# Patient Record
Sex: Female | Born: 1975 | Race: White | Hispanic: No | Marital: Married | Smoking: Never smoker
Health system: Southern US, Community
[De-identification: ages and names within clinical notes are randomized; demographics above are authoritative.]

## PROBLEM LIST (undated history)

## (undated) DIAGNOSIS — N800 Endometriosis of uterus: Secondary | ICD-10-CM

## (undated) DIAGNOSIS — F988 Other specified behavioral and emotional disorders with onset usually occurring in childhood and adolescence: Secondary | ICD-10-CM

## (undated) DIAGNOSIS — F32A Depression, unspecified: Secondary | ICD-10-CM

## (undated) DIAGNOSIS — R7611 Nonspecific reaction to tuberculin skin test without active tuberculosis: Secondary | ICD-10-CM

## (undated) DIAGNOSIS — I7300 Raynaud's syndrome without gangrene: Secondary | ICD-10-CM

## (undated) DIAGNOSIS — F329 Major depressive disorder, single episode, unspecified: Secondary | ICD-10-CM

## (undated) DIAGNOSIS — E039 Hypothyroidism, unspecified: Secondary | ICD-10-CM

## (undated) HISTORY — PX: COLONOSCOPY: SHX174

## (undated) HISTORY — PX: DIAGNOSTIC LAPAROSCOPY: SUR761

## (undated) HISTORY — PX: CERVICAL CONE BIOPSY: SUR198

## (undated) HISTORY — PX: ESOPHAGOGASTRODUODENOSCOPY: SHX1529

---

## 2002-11-28 ENCOUNTER — Other Ambulatory Visit: Admission: RE | Admit: 2002-11-28 | Discharge: 2002-11-28 | Payer: Self-pay | Admitting: Family Medicine

## 2002-12-04 ENCOUNTER — Encounter: Admission: RE | Admit: 2002-12-04 | Discharge: 2002-12-26 | Payer: Self-pay | Admitting: Family Medicine

## 2003-12-18 ENCOUNTER — Other Ambulatory Visit: Admission: RE | Admit: 2003-12-18 | Discharge: 2003-12-18 | Payer: Self-pay | Admitting: Family Medicine

## 2008-05-30 ENCOUNTER — Emergency Department (HOSPITAL_COMMUNITY): Admission: EM | Admit: 2008-05-30 | Discharge: 2008-05-30 | Payer: Self-pay | Admitting: *Deleted

## 2008-09-14 ENCOUNTER — Ambulatory Visit: Payer: Self-pay

## 2008-11-19 ENCOUNTER — Other Ambulatory Visit: Payer: Self-pay | Admitting: Physician Assistant

## 2008-11-24 ENCOUNTER — Other Ambulatory Visit: Payer: Self-pay | Admitting: Internal Medicine

## 2009-01-08 ENCOUNTER — Ambulatory Visit: Payer: Self-pay | Admitting: Gastroenterology

## 2009-01-12 ENCOUNTER — Other Ambulatory Visit: Payer: Self-pay | Admitting: Rheumatology

## 2009-01-22 ENCOUNTER — Telehealth: Payer: Self-pay | Admitting: Gastroenterology

## 2009-05-10 ENCOUNTER — Other Ambulatory Visit: Payer: Self-pay | Admitting: Obstetrics and Gynecology

## 2009-05-31 ENCOUNTER — Ambulatory Visit: Payer: Self-pay | Admitting: Obstetrics and Gynecology

## 2009-06-04 ENCOUNTER — Ambulatory Visit: Payer: Self-pay | Admitting: Obstetrics and Gynecology

## 2009-07-05 ENCOUNTER — Other Ambulatory Visit: Payer: Self-pay | Admitting: Internal Medicine

## 2009-07-09 ENCOUNTER — Other Ambulatory Visit: Payer: Self-pay | Admitting: Internal Medicine

## 2009-07-15 ENCOUNTER — Other Ambulatory Visit: Payer: Self-pay | Admitting: Internal Medicine

## 2009-11-19 ENCOUNTER — Other Ambulatory Visit: Payer: Self-pay | Admitting: Internal Medicine

## 2010-06-16 ENCOUNTER — Encounter: Payer: Self-pay | Admitting: Obstetrics and Gynecology

## 2010-12-06 ENCOUNTER — Observation Stay: Payer: Self-pay | Admitting: Obstetrics and Gynecology

## 2010-12-12 ENCOUNTER — Observation Stay: Payer: Self-pay | Admitting: Obstetrics and Gynecology

## 2010-12-19 ENCOUNTER — Inpatient Hospital Stay: Payer: Self-pay | Admitting: Obstetrics and Gynecology

## 2012-07-10 ENCOUNTER — Ambulatory Visit: Payer: Self-pay

## 2013-04-04 ENCOUNTER — Other Ambulatory Visit: Payer: Self-pay | Admitting: Neurology

## 2013-04-04 DIAGNOSIS — R413 Other amnesia: Secondary | ICD-10-CM

## 2013-04-04 DIAGNOSIS — R410 Disorientation, unspecified: Secondary | ICD-10-CM

## 2013-04-19 ENCOUNTER — Ambulatory Visit
Admission: RE | Admit: 2013-04-19 | Discharge: 2013-04-19 | Disposition: A | Discharge status: Home / Self Care | Payer: BC Managed Care – PPO | Source: Ambulatory Visit | Attending: Neurology | Admitting: Neurology

## 2013-04-19 DIAGNOSIS — R410 Disorientation, unspecified: Secondary | ICD-10-CM

## 2013-04-19 DIAGNOSIS — R413 Other amnesia: Secondary | ICD-10-CM

## 2013-04-19 MED ORDER — GADOBENATE DIMEGLUMINE 529 MG/ML IV SOLN
11.0000 mL | Freq: Once | INTRAVENOUS | Status: AC | PRN
  Administered 2013-04-19: 11 mL via INTRAVENOUS

## 2014-06-02 ENCOUNTER — Ambulatory Visit: Payer: Self-pay

## 2014-06-19 ENCOUNTER — Ambulatory Visit: Payer: Self-pay

## 2015-03-24 ENCOUNTER — Other Ambulatory Visit: Payer: Self-pay | Admitting: Obstetrics and Gynecology

## 2015-03-24 DIAGNOSIS — N63 Unspecified lump in unspecified breast: Secondary | ICD-10-CM

## 2015-06-04 ENCOUNTER — Other Ambulatory Visit: Payer: Self-pay

## 2015-06-04 ENCOUNTER — Ambulatory Visit: Payer: Self-pay

## 2015-06-18 ENCOUNTER — Other Ambulatory Visit: Payer: Self-pay

## 2015-06-18 ENCOUNTER — Ambulatory Visit: Payer: Self-pay

## 2015-06-25 ENCOUNTER — Ambulatory Visit
Admission: RE | Admit: 2015-06-25 | Discharge: 2015-06-25 | Disposition: A | Discharge status: Home / Self Care | Payer: Managed Care, Other (non HMO) | Source: Ambulatory Visit | Attending: Obstetrics and Gynecology | Admitting: Obstetrics and Gynecology

## 2015-06-25 ENCOUNTER — Other Ambulatory Visit: Payer: Self-pay | Admitting: Obstetrics and Gynecology

## 2015-06-25 DIAGNOSIS — N63 Unspecified lump in unspecified breast: Secondary | ICD-10-CM

## 2015-06-25 DIAGNOSIS — N6489 Other specified disorders of breast: Secondary | ICD-10-CM | POA: Insufficient documentation

## 2015-07-02 ENCOUNTER — Ambulatory Visit: Payer: Managed Care, Other (non HMO)

## 2016-07-06 ENCOUNTER — Encounter: Payer: Self-pay | Admitting: *Deleted

## 2016-07-07 ENCOUNTER — Ambulatory Visit
Admission: RE | Admit: 2016-07-07 | Discharge: 2016-07-07 | Disposition: A | Discharge status: Home / Self Care | Payer: Managed Care, Other (non HMO) | Source: Ambulatory Visit | Attending: Gastroenterology | Admitting: Gastroenterology

## 2016-07-07 ENCOUNTER — Encounter
Admission: RE | Disposition: A | Discharge status: Home / Self Care | Payer: Self-pay | Source: Ambulatory Visit | Attending: Gastroenterology

## 2016-07-07 ENCOUNTER — Ambulatory Visit: Payer: Managed Care, Other (non HMO) | Admitting: Anesthesiology

## 2016-07-07 ENCOUNTER — Encounter: Payer: Self-pay | Admitting: *Deleted

## 2016-07-07 DIAGNOSIS — F909 Attention-deficit hyperactivity disorder, unspecified type: Secondary | ICD-10-CM | POA: Diagnosis not present

## 2016-07-07 DIAGNOSIS — Z800 Family history of malignant neoplasm of digestive organs: Secondary | ICD-10-CM | POA: Insufficient documentation

## 2016-07-07 DIAGNOSIS — Z1211 Encounter for screening for malignant neoplasm of colon: Secondary | ICD-10-CM | POA: Diagnosis not present

## 2016-07-07 DIAGNOSIS — I739 Peripheral vascular disease, unspecified: Secondary | ICD-10-CM | POA: Insufficient documentation

## 2016-07-07 DIAGNOSIS — I7300 Raynaud's syndrome without gangrene: Secondary | ICD-10-CM | POA: Insufficient documentation

## 2016-07-07 DIAGNOSIS — F329 Major depressive disorder, single episode, unspecified: Secondary | ICD-10-CM | POA: Diagnosis not present

## 2016-07-07 DIAGNOSIS — Z79899 Other long term (current) drug therapy: Secondary | ICD-10-CM | POA: Insufficient documentation

## 2016-07-07 DIAGNOSIS — E039 Hypothyroidism, unspecified: Secondary | ICD-10-CM | POA: Insufficient documentation

## 2016-07-07 DIAGNOSIS — K589 Irritable bowel syndrome without diarrhea: Secondary | ICD-10-CM | POA: Insufficient documentation

## 2016-07-07 HISTORY — DX: Other specified behavioral and emotional disorders with onset usually occurring in childhood and adolescence: F98.8

## 2016-07-07 HISTORY — DX: Major depressive disorder, single episode, unspecified: F32.9

## 2016-07-07 HISTORY — DX: Depression, unspecified: F32.A

## 2016-07-07 HISTORY — DX: Endometriosis of uterus: N80.0

## 2016-07-07 HISTORY — DX: Hypothyroidism, unspecified: E03.9

## 2016-07-07 HISTORY — PX: COLONOSCOPY WITH PROPOFOL: SHX5780

## 2016-07-07 HISTORY — DX: Raynaud's syndrome without gangrene: I73.00

## 2016-07-07 HISTORY — DX: Nonspecific reaction to tuberculin skin test without active tuberculosis: R76.11

## 2016-07-07 LAB — MAGNESIUM: MAGNESIUM: 2.1 mg/dL (ref 1.7–2.4)

## 2016-07-07 SURGERY — COLONOSCOPY WITH PROPOFOL
Anesthesia: General

## 2016-07-07 MED ORDER — SODIUM CHLORIDE 0.9 % IV SOLN: INTRAVENOUS | Status: DC

## 2016-07-07 MED ORDER — PROPOFOL 500 MG/50ML IV EMUL
INTRAVENOUS | Status: AC
  Filled 2016-07-07: qty 50

## 2016-07-07 MED ORDER — SODIUM CHLORIDE 0.9 % IV SOLN
INTRAVENOUS | Status: DC
  Administered 2016-07-07: 09:00:00 via INTRAVENOUS

## 2016-07-07 MED ORDER — PROPOFOL 500 MG/50ML IV EMUL
INTRAVENOUS | Status: DC | PRN
  Administered 2016-07-07: 100 ug/kg/min via INTRAVENOUS

## 2016-07-07 MED ORDER — KETOROLAC TROMETHAMINE 30 MG/ML IJ SOLN
INTRAMUSCULAR | Status: DC | PRN
  Administered 2016-07-07: 600 mg via INTRAVENOUS

## 2016-07-07 NOTE — H&P (Signed)
Outpatient short stay form Pre-procedure 07/07/2016 9:05 AM Christena DeemMartin U Chris Narasimhan MD  Primary Physician: Dr. Daniel NonesBert Klein  Reason for visit:  Colonoscopy  History of present illness:  Patient is a 41 year old female presenting today as above. She has a personal history of IBS c and has [facility]. Depacon review was relatively normal. Her last colonoscopy was about 7 years or so ago this indicating a mildly dilated colon. He is on high-dose MiraLAX. Have a history of Raynaud's syndrome. She tolerated her prep well. She takes no aspirin or blood thinning agents. She has a recent historical finding of her sister having colon cancer requiring surgery about 6 months ago.    Current Facility-Administered Medications:  .  0.9 %  sodium chloride infusion, , Intravenous, Continuous, Christena DeemMartin U Jasdeep Dejarnett, MD .  0.9 %  sodium chloride infusion, , Intravenous, Continuous, Christena DeemMartin U Iliany Losier, MD  Prescriptions Prior to Admission  Medication Sig Dispense Refill Last Dose  . amLODipine (NORVASC) 2.5 MG tablet Take 5 mg by mouth daily.   07/06/2016 at Unknown time  . Ascorbic Acid (VITAMIN C) 1000 MG tablet Take 1,000 mg by mouth 2 (two) times daily.   07/06/2016 at Unknown time  . levothyroxine (SYNTHROID, LEVOTHROID) 50 MCG tablet Take 50 mcg by mouth daily before breakfast.   07/07/2016 at Unknown time  . linaclotide (LINZESS) 290 MCG CAPS capsule Take 290 mcg by mouth daily before breakfast.   07/06/2016 at Unknown time  . Multiple Vitamin (MULTIVITAMIN) tablet Take 1 tablet by mouth daily.   07/06/2016 at Unknown time  . Probiotic Product (PROBIOTIC COLON SUPPORT PO) Take 240 mg by mouth daily.     . simethicone (MYLICON) 125 MG chewable tablet Chew 125 mg by mouth 3 (three) times daily with meals.   07/06/2016 at Unknown time     No Known Allergies   Past Medical History:  Diagnosis Date  . ADD (attention deficit disorder) without hyperactivity   . Depression   . Endometriosis of uterus   .  Hypothyroidism   . Positive TB test   . Raynaud's disease     Review of systems:      Physical Exam    Heart and lungs: Regular rate and rhythm without rub or gallop, lungs are bilaterally clear.    HEENT: Normocephalic atraumatic eyes are anicteric    Other:     Pertinant exam for procedure: Soft nontender nondistended bowel sounds positive normoactive.    Planned proceedures: Colonoscopy and indicated procedures. I have discussed the risks benefits and complications of procedures to include not limited to bleeding, infection, perforation and the risk of sedation and the patient wishes to proceed.    Christena DeemMartin U Mearle Drew, MD Gastroenterology 07/07/2016  9:05 AM

## 2016-07-07 NOTE — Transfer of Care (Signed)
Immediate Anesthesia Transfer of Care Note  Patient: Tina Fowler  Procedure(s) Performed: Procedure(s): COLONOSCOPY WITH PROPOFOL (N/A)  Patient Location: PACU  Anesthesia Type:General  Level of Consciousness: awake, alert  and oriented  Airway & Oxygen Therapy: Patient Spontanous Breathing and Patient connected to nasal cannula oxygen  Post-op Assessment: Report given to RN and Post -op Vital signs reviewed and stable  Post vital signs: Reviewed and stable  Last Vitals:  Vitals:   07/07/16 0839  BP: 125/85  Pulse: 65  Resp: 20  Temp: 36.1 C    Last Pain:  Vitals:   07/07/16 0839  TempSrc: Tympanic         Complications: No apparent anesthesia complications

## 2016-07-07 NOTE — Anesthesia Post-op Follow-up Note (Cosign Needed)
Anesthesia QCDR form completed.        

## 2016-07-07 NOTE — Op Note (Addendum)
[facility] Gastroenterology Patient Name: Tina Fowler Procedure Date: 07/07/2016 8:45 AM MRN: [id number] Account #: [id number] Date of Birth: [birth year] Admit Type: Outpatient Age: 41 Room: [facility] ENDO ROOM 3 Gender: Female Note Status: Finalized Procedure:            Colonoscopy Indications:          Family history of colon cancer in a first-degree                        relative Providers:            Christena Deem, MD Referring MD:         Daniel Nones, MD (Referring MD) Medicines:            Monitored Anesthesia Care Complications:        No immediate complications. Procedure:            Pre-Anesthesia Assessment:                       - ASA Grade Assessment: II - A patient with mild                        systemic disease.                       After obtaining informed consent, the colonoscope was                        passed under direct vision. Throughout the procedure,                        the patient's blood pressure, pulse, and oxygen                        saturations were monitored continuously. The                        Colonoscope was introduced through the anus and                        advanced to the the cecum, identified by appendiceal                        orifice and ileocecal valve. The colonoscopy was                        performed without difficulty. The patient tolerated the                        procedure well. The quality of the bowel preparation                        was good. Findings:      The sigmoid colon, descending colon and transverse colon were moderately       tortuous.      A diffuse area of minimal melanosis was found in the entire colon.      The exam was otherwise without abnormality.      The digital rectal exam findings include finding of a high resting tone       at the anal orifice without evidence of actual stricture.  The retroflexed view of the distal rectum and anal verge was normal and        showed no anal or rectal abnormalities. Impression:           - Tortuous colon.                       - Melanosis in the colon.                       - The examination was otherwise normal.                       - Finding of a high resting tone at the anal orifice                        found on digital rectal exam.                       - The distal rectum and anal verge are normal on                        retroflexion view.                       - No specimens collected. Recommendation:       - Discharge patient to home.                       - Perform anal manometry at appointment to be scheduled.                       - Check anticentromere antibody and SCL 70, Magnesium                        and calcium levels. today.                       - Perform colonic transit study at appointment to be                        scheduled. Procedure Code(s):    --- Professional ---                       [phone], Colonoscopy, flexible; diagnostic, including                        collection of specimen(s) by brushing or washing, when                        performed (separate procedure) Diagnosis Code(s):    --- Professional ---                       X91.47, Other specified diseases of intestine                       Z80.0, Family history of malignant neoplasm of                        digestive organs                       Q43.8, Other  specified congenital malformations of                        intestine CPT copyright 2016 American Medical Association. All rights reserved. The codes documented in this report are preliminary and upon coder review may  be revised to meet current compliance requirements. Christena Deem, MD 07/07/2016 9:43:20 AM This report has been signed electronically. Number of Addenda: 0 Note Initiated On: 07/07/2016 8:45 AM Scope Withdrawal Time: 0 hours 9 minutes 52 seconds  Total Procedure Duration: 0 hours 21 minutes 54 seconds       [facility]

## 2016-07-07 NOTE — Anesthesia Preprocedure Evaluation (Signed)
Anesthesia Evaluation  Patient identified by MRN, date of birth, ID band Patient awake    Reviewed: Allergy & Precautions, H&P , NPO status , Patient's Chart, lab work & pertinent test results, reviewed documented beta blocker date and time   Airway Mallampati: II   Neck ROM: full    Dental  (+) Teeth Intact   Pulmonary neg pulmonary ROS,    Pulmonary exam normal        Cardiovascular + Peripheral Vascular Disease  negative cardio ROS Normal cardiovascular exam Rhythm:regular Rate:Normal     Neuro/Psych PSYCHIATRIC DISORDERS negative neurological ROS  negative psych ROS   GI/Hepatic negative GI ROS, Neg liver ROS,   Endo/Other  negative endocrine ROSHypothyroidism   Renal/GU negative Renal ROS  negative genitourinary   Musculoskeletal   Abdominal   Peds  Hematology negative hematology ROS (+)   Anesthesia Other Findings Past Medical History: No date: ADD (attention deficit disorder) without hyper* No date: Depression No date: Endometriosis of uterus No date: Hypothyroidism No date: Positive TB test No date: Raynaud's disease Past Surgical History: No date: CERVICAL CONE BIOPSY No date: COLONOSCOPY No date: DIAGNOSTIC LAPAROSCOPY     Comment: Pelvic (endometriosis) No date: ESOPHAGOGASTRODUODENOSCOPY BMI    Body Mass Index:  21.30 kg/m     Reproductive/Obstetrics negative OB ROS                             Anesthesia Physical Anesthesia Plan  ASA: II  Anesthesia Plan: General   Post-op Pain Management:    Induction:   Airway Management Planned:   Additional Equipment:   Intra-op Plan:   Post-operative Plan:   Informed Consent: I have reviewed the patients History and Physical, chart, labs and discussed the procedure including the risks, benefits and alternatives for the proposed anesthesia with the patient or authorized representative who has indicated his/her  understanding and acceptance.   Dental Advisory Given  Plan Discussed with: CRNA  Anesthesia Plan Comments:         Anesthesia Quick Evaluation

## 2016-07-08 LAB — MISC LABCORP TEST (SEND OUT): LABCORP TEST CODE: 164814

## 2016-07-08 LAB — CALCIUM, IONIZED: CALCIUM, IONIZED, SERUM: 4.9 mg/dL (ref 4.5–5.6)

## 2016-07-08 LAB — ANTI-SCLERODERMA ANTIBODY

## 2016-07-10 ENCOUNTER — Encounter: Payer: Self-pay | Admitting: Gastroenterology

## 2016-07-11 NOTE — Anesthesia Postprocedure Evaluation (Signed)
Anesthesia Post Note  Patient: Tina Fowler [name] Procedure(s) Performed: Procedure(s) (LRB): COLONOSCOPY WITH PROPOFOL (N/A)  Patient location during evaluation: PACU Anesthesia Type: General Level of consciousness: awake and alert Pain management: pain level not controlled Vital Signs Assessment: post-procedure vital signs reviewed and stable Respiratory status: spontaneous breathing, nonlabored ventilation, respiratory function stable and patient connected to nasal cannula oxygen Cardiovascular status: blood pressure returned to baseline and stable Postop Assessment: no signs of nausea or vomiting Anesthetic complications: no     Last Vitals:  Vitals:   07/07/16 0959 07/07/16 1009  BP: 109/80 118/85  Pulse: 65 61  Resp: 18 11  Temp:      Last Pain:  Vitals:   07/07/16 0939  TempSrc: Tympanic                 Yevette EdwardsJames G Adams

## 2019-05-27 ENCOUNTER — Ambulatory Visit: Payer: Managed Care, Other (non HMO)

## 2019-06-06 ENCOUNTER — Ambulatory Visit: Payer: Managed Care, Other (non HMO)

## 2019-06-10 ENCOUNTER — Ambulatory Visit: Payer: Managed Care, Other (non HMO)

## 2019-06-20 ENCOUNTER — Ambulatory Visit: Payer: Managed Care, Other (non HMO) | Attending: Obstetrics & Gynecology

## 2019-06-20 ENCOUNTER — Other Ambulatory Visit: Payer: Self-pay

## 2019-06-20 DIAGNOSIS — M533 Sacrococcygeal disorders, not elsewhere classified: Secondary | ICD-10-CM | POA: Diagnosis present

## 2019-06-20 DIAGNOSIS — M4125 Other idiopathic scoliosis, thoracolumbar region: Secondary | ICD-10-CM | POA: Diagnosis not present

## 2019-06-20 DIAGNOSIS — M62838 Other muscle spasm: Secondary | ICD-10-CM | POA: Diagnosis present

## 2019-06-20 NOTE — Therapy (Signed)
[facility] MAIN [facility] SERVICES [address] [place], [place], [address] Phone: [phone]   Fax:  [phone]  Physical Therapy Treatment  The patient has been informed of current processes in place at Outpatient Rehab to protect patients from Covid-19 exposure including social distancing, schedule modifications, and new cleaning procedures. After discussing their particular risk with a therapist based on the patient's personal risk factors, the patient has decided to proceed with in-person therapy.   Patient Details  Name: Tina Fowler MRN: [id number] Date of Birth: July 10, 1975 No data recorded  Encounter Date: 06/20/2019  PT End of Session - 06/20/19 1342    Visit Number  1    Number of Visits  10    Date for PT Re-Evaluation  08/29/19    Authorization Type  [employer]    Authorization - Visit Number  1    Authorization - Number of Visits  10    Progress Note Due on Visit  10    PT Start Time  0805    PT Stop Time  0905    PT Time Calculation (min)  60 min    Activity Tolerance  Patient tolerated treatment well    Behavior During Therapy  [facility] for tasks assessed/performed       Past Medical History:  Diagnosis Date  . ADD (attention deficit disorder) without hyperactivity   . Depression   . Endometriosis of uterus   . Hypothyroidism   . Positive TB test   . Raynaud's disease     Past Surgical History:  Procedure Laterality Date  . CERVICAL CONE BIOPSY    . COLONOSCOPY    . COLONOSCOPY WITH PROPOFOL N/A 07/07/2016   Procedure: COLONOSCOPY WITH PROPOFOL;  Surgeon: Christena Deem, MD;  Location: [facility] ENDOSCOPY;  Service: Endoscopy;  Laterality: N/A;  . DIAGNOSTIC LAPAROSCOPY     Pelvic (endometriosis)  . ESOPHAGOGASTRODUODENOSCOPY      There were no vitals filed for this visit.   Pelvic Floor Physical Therapy Evaluation and Assessment  SCREENING  Falls in last 6 mo: no  Red Flags: Have you had any night sweats?  Occasionally for ~ 1 year Unexplained weight loss? none Saddle anesthesia? none Unexplained changes in bowel or bladder habits? none  SUBJECTIVE  Patient reports: Went through something (suspected lupis) where she lost a lot of hair and her hormone levels went crazy, she is doing better other than occasionally "her back is on fire". "If I feel a little bit of stress it affects it".  Has had constipation for years (10+), had balloon test done and was unable to expel balloon.  Taking Melatonin. Chicken bone-broth. Helping with pain Pain/bloating tends to be worse on the L side.  Was doing acupuncture and cupping 1x/month which helped with the BM's which shut down due to covid.  Works out at [employer], tries to strengthen her back "all the time" does ~ 10 min. Of cardio, then weight-lifting 4 days of weight-lifting at medium weight.   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 Saturates a period pad with running, wearing a panty-liner throughout the day.  Drinks only water throughout the day ~ 1/2 gallon.   Gastrointestinal History: Has  had constipation even since she way in high-school and has always had a hard time having a BM. "It is either runny or I don't go"  Eating granola bar or oatmeal for breakfast, almonds for sack, protein shake and a piece of fruit for lunch and salmon and vegetables for dinner.  Sexual activity/pain: Sometimes is uncomfortable because of pressure/bloating/bladder full feeling.   Location of pain: upper to low back worsens during menstrual cycle Current pain:  0/10  (tight)  Max pain:  8/10 Least pain:  0/10 Nature of pain: Sharp and dull  Patient Goals: Having her bowel's work, having a BM daily.   OBJECTIVE  Posture/Observations:  Sitting:  Standing: R ASIS high, slight hyperkyphosis/lordis, R PSIS high Supine: L ASIS high, LLE long Prone: R PSIS high  Palpation/Segmental Motion/Joint Play:  Special tests:   Scoliosis: R lumbar/L thoracic scoliosis Supine-to-long-sit: LLE long in both but less-short in sitting  Range of Motion/Flexibilty:  Spine: R SB 1 finger from knee, L SB to knee. L rotation ~ 25% restricted. Hips:   Strength/MMT: Deferred to follow up LE MMT  LE MMT Left Right  Hip flex:  (L2) /5 /5  Hip ext: /5 /5  Hip abd: /5 /5  Hip add: /5 /5  Hip IR /5 /5  Hip ER /5 /5     Abdominal:  Palpation: TTP to B Iliacus Diastasis: 3 fingers at umbilicus  Pelvic Floor External Exam: Deferred to follow up Introitus Appears:  Skin integrity:  Palpation: Cough: Prolapse visible?: Scar mobility:  Internal Vaginal Exam: Strength (PERF):  Symmetry: Palpation: Prolapse:   Internal Rectal Exam: Strength (PERF): Symmetry: Palpation: Prolapse:   Gait Analysis: Deferred to follow up   Pelvic Floor Outcome Measures: FOTO PFDI Bowel:  17, Urinary problem: 52, Bowel Cnst: 43, PFDI Urinary: 29  INTERVENTIONS THIS SESSION: Self-care: Educated on the structure and function of the pelvic floor in relation to their symptoms as well as the POC, and initial HEP in order to set patient expectations and understanding from which we will build on in the future sessions.   Total time: 60 min.                              PT Short Term Goals - 06/20/19 1417      PT SHORT TERM GOAL #1   Title  Patient will demonstrate improved pelvic alignment and balance of musculature surrounding the pelvis to facilitate decreased PFM spasms and decrease pelvic pain.    Baseline  L up-slip and Anterior  rotation, spasms through posterior hip, Iliacus, and back    Time  5    Period  Weeks    Status  New    Target Date  07/25/19      PT SHORT TERM GOAL #2   Title  Patient will demonstrate a coordinated contraction, relaxation, and bulge of the pelvic floor muscles to demonstrate functional recruitment and motion and allow for further strengthening.    Baseline  Pt. Hx. suggests PFM spasms and poor coordination    Time  5    Period  Weeks    Status  New    Target Date  07/25/19      PT SHORT TERM GOAL #3   Title  Patient will demonstrate HEP x1 in the clinic to demonstrate understanding and proper form to allow for further improvement.    Baseline  Pt. lacks knowledge of therapeutic exercise that  will decrease Sx.    Time  5    Period  Weeks    Status  New    Target Date  07/25/19        PT Long Term Goals - 06/20/19 1441      PT LONG TERM GOAL #1   Title  Patient will report no episodes of SUI over the course of the prior two weeks to demonstrate improved functional ability.    Baseline  Pt. having SUI with running, jumping, sneezing that fills a pad    Time  10    Period  Weeks    Status  New    Target Date  08/29/19      PT LONG TERM GOAL #2   Title  Patient will describe pain no greater than 2/10 over two weeks to demonstrate improved functional ability.    Baseline  Max of 8/10    Time  5    Period  Weeks    Status  New    Target Date  07/25/19      PT LONG TERM GOAL #3   Title  Patient will report having BM's at least every-other day with consistency between [facility] stool scale 3-5 over the prior week to demonstrate decreased constipation.    Baseline  Pt. goes 3-4 days between BM's, slow motility.    Time  10    Period  Days    Status  New    Target Date  08/29/19      PT LONG TERM GOAL #4   Title  Pt. will demonstrate an improvement in FOTO score by 10 points in each category to demonstrate improved function.    Baseline  FOTO PFDI Bowel:  17, Urinary  problem: 52, Bowel Cnst: 43, PFDI Urinary: 29    Time  10    Period  Weeks    Status  New    Target Date  08/29/19            Plan - 06/20/19 1344    Clinical Impression Statement  Pt. is a 44 y/o female who presents today with cheif c/o constipation, SUI, and LBP. Her PMH is significant for 1 vaginal delivery with lateral episiotomy, ADD, Depression, Endometriosis, hypothyroidism, and Raynaud's. Her clinical assessment revealed a diastasis recti of 3 fingers at umbilicus, mild R lumbar-L thoracic scoliosis. L up-slip and anterior rotation, and spasms through B iliacus, posterior hip, and lumbar extensors. She will benefit from skilled pelvic health PT to address the noted deficits, and to continue to assess for and address any other potential causes of Sx.    Personal Factors and Comorbidities  Comorbidity 3+    Comorbidities  ADD, Depression, Endometriosis, hypothyroidism, and Raynaud's    Examination-Activity Limitations  Continence;Toileting    Examination-Participation Restrictions  Interpersonal Relationship;Community Activity;Other    Stability/Clinical Decision Making  Evolving/Moderate complexity    Clinical Decision Making  Moderate    Rehab Potential  Good    PT Frequency  1x / week    PT Duration  Other (comment)   10 weeks   PT Treatment/Interventions  ADLs/Self Care Home Management;Biofeedback;Moist Heat;Electrical Stimulation;Therapeutic activities;Functional mobility training;Gait training;Therapeutic exercise;Neuromuscular re-education;Patient/family education;Scar mobilization;Manual techniques;Passive range of motion;Dry needling;Taping;Spinal Manipulations;Joint Manipulations    PT Next Visit Plan  TP release/DN and sacral mobs, up-slip correction  for L up-slip and anterior rotation.    Consulted and Agree with Plan of Care  Patient       Patient will benefit from skilled  therapeutic intervention in order to improve the following deficits and impairments:   Increased muscle spasms, Improper body mechanics, Decreased coordination, Postural dysfunction, Pain, Increased fascial restricitons  Visit Diagnosis: Other idiopathic scoliosis, thoracolumbar region  Sacrococcygeal disorders, not elsewhere classified  Other muscle spasm     Problem List There are no problems to display for this patient.  Willa Rough DPT, ATC Willa Rough 06/20/2019, 2:52 PM  [place] MAIN [facility] SERVICES [address] [place], [place], [address] Phone: [phone]   Fax:  [phone]  Name: Tina Fowler MRN: [id number] Date of Birth: 22-Oct-1975

## 2019-06-22 ENCOUNTER — Ambulatory Visit: Payer: Managed Care, Other (non HMO) | Attending: Internal Medicine

## 2019-06-22 DIAGNOSIS — Z23 Encounter for immunization: Secondary | ICD-10-CM | POA: Insufficient documentation

## 2019-06-22 NOTE — Progress Notes (Signed)
   Covid-19 Vaccination Clinic  Name:  Tina Fowler    MRN: [id number] DOB: [birth year]  06/22/2019  Tina Fowler was observed post Covid-19 immunization for 15 minutes without incident. She was provided with Vaccine Information Sheet and instruction to access the V-Safe system.   Tina Fowler was instructed to call 911 with any severe reactions post vaccine: Marland Kitchen Difficulty breathing  . Swelling of face and throat  . A fast heartbeat  . A bad rash all over body  . Dizziness and weakness   Immunizations Administered    Name Date Dose VIS Date Route   Pfizer COVID-19 Vaccine 06/22/2019  3:19 PM 0.3 mL 03/28/2019 Intramuscular   Manufacturer: [employer], [employer]   Lot: [id number]   NDC: [address]-1

## 2019-06-25 ENCOUNTER — Ambulatory Visit: Payer: Managed Care, Other (non HMO)

## 2019-07-08 ENCOUNTER — Ambulatory Visit: Payer: Managed Care, Other (non HMO)

## 2019-07-15 ENCOUNTER — Ambulatory Visit: Payer: Managed Care, Other (non HMO)

## 2019-07-16 ENCOUNTER — Ambulatory Visit: Payer: Managed Care, Other (non HMO)

## 2019-07-16 ENCOUNTER — Other Ambulatory Visit: Payer: Self-pay

## 2019-07-16 DIAGNOSIS — M4125 Other idiopathic scoliosis, thoracolumbar region: Secondary | ICD-10-CM | POA: Diagnosis not present

## 2019-07-16 DIAGNOSIS — M533 Sacrococcygeal disorders, not elsewhere classified: Secondary | ICD-10-CM

## 2019-07-16 DIAGNOSIS — M62838 Other muscle spasm: Secondary | ICD-10-CM

## 2019-07-16 NOTE — Patient Instructions (Signed)
Bowel retraining program: 1) Start by drinking a hot (optionally caffeinated) beverage 2) Do your "I love you" colonic massage  3) Go for a short walk 4) Go to the toilet and sit with feet up on squatty potty and relaxing forward on your knees with tall spine. Take deep, lengthening, breaths and allow up to 10 minutes to have a BM without "straining" before you move on with the day.      The "I Love You" massage for your colon  Start by resting or lying quietly. 1. Using your fingertips, you apply light pressure in a stroking motion. 2. Start with your hands on the left hand side of your abdomen, below the rib cage, and stroke or make small circles down towards your left hip. This is the "I" of the "I Love You" massage. 3. Next, you are going to make the strokes in an upside down "L" shape. Run your fingertips from the right side of your upper abdomen, across under your ribs, and down the left side. 4. Now you are going to run through the whole path. This is the "U". Start on the bottom right of your abdomen. Stroke up the right side, across under the rib cage, and down the left side.   5. Finally, Using your fingertips, you apply light pressure in small circles  through the whole "U" path to "wake up" the smooth muscles of the intestines and get things moving.    Up the right, across under the rib cage, down the left and inwards, moving in a clockwise motion (if you are looking down upon your own abdomen)  Essentially you are massaging along the path of your large intestine. Our colon starts roughly in the bottom right of our abdomen, travels up the right hand side, turns and runs across below our rib cage, and then down the left side and in towards the pubic bone. When I teach this massage for people to do at home I have them start with 10 minutes. However, anecdotally, many people tell me 15-20 minutes really gets things going! After about 5 minutes of this massage my insides start  gurgling and making noises. 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 A big problem is constipation resulting from either medication side effects, post surgical changes, or the fact that in general people in the hospital don't move as much (and exercise such as walking also helps regulate our digestive system). One of the first "exercises" I would teach them is how to do the "I Love You" abdominal massage. Time and time again I have people come back to me and say that massaging their abdominal tissue helped their digestive issues. Give it a try today!  *Adapted from article written by Harriet Butte, PT, DPT   Child's Pose Pelvic Floor Lengthening    Sit in knee-chest position and reach arms forward. Separate knees for comfort. Hold position for _5__ breaths. Repeat _2-3__ times. Do _1-2__ times per day.

## 2019-07-16 NOTE — Therapy (Addendum)
[place] [facility] MAIN [facility] SERVICES [address] [place], [place], [address] Phone: [phone]   Fax:  [phone]  Physical Therapy Treatment  The patient has been informed of current processes in place at Outpatient Rehab to protect patients from Covid-19 exposure including social distancing, schedule modifications, and new cleaning procedures. After discussing their particular risk with a therapist based on the patient's personal risk factors, the patient has decided to proceed with in-person therapy.   Patient Details  Name: Tina Fowler MRN: [id number] Date of Birth: 1975-09-02 No data recorded  Encounter Date: 07/16/2019  PT End of Session - 07/23/19 1008    Visit Number  2    Number of Visits  10    Date for PT Re-Evaluation  08/29/19    Authorization Type  [employer]    Authorization - Visit Number  2    Authorization - Number of Visits  10    Progress Note Due on Visit  10    PT Start Time  1730    PT Stop Time  1830    PT Time Calculation (min)  60 min    Activity Tolerance  Patient tolerated treatment well;No increased pain    Behavior During Therapy  WFL for tasks assessed/performed       Past Medical History:  Diagnosis Date  . ADD (attention deficit disorder) without hyperactivity   . Depression   . Endometriosis of uterus   . Hypothyroidism   . Positive TB test   . Raynaud's disease     Past Surgical History:  Procedure Laterality Date  . CERVICAL CONE BIOPSY    . COLONOSCOPY    . COLONOSCOPY WITH PROPOFOL N/A 07/07/2016   Procedure: COLONOSCOPY WITH PROPOFOL;  Surgeon: Christena Deem, MD;  Location: [facility] ENDOSCOPY;  Service: Endoscopy;  Laterality: N/A;  . DIAGNOSTIC LAPAROSCOPY     Pelvic (endometriosis)  . ESOPHAGOGASTRODUODENOSCOPY      There were no vitals filed for this visit.   Pelvic Floor Physical Therapy Treatment Note  SCREENING  Changes in medications, allergies, or medical history?:  no    SUBJECTIVE  Patient reports: When she feels like things aren't moving, she gets a shooting pain in her back.  Precautions:  ADD, Depression, Endometriosis, hypothyroidism, Raynaud's  Sexual activity/pain: Sometimes is uncomfortable because of pressure/bloating/bladder full feeling.   Location of pain: upper to low back worsens during menstrual cycle Current pain: 0/10 (tight)  Max pain: 8/10 Least pain: 0/10 Nature of pain:Sharp and dull  Patient Goals: Having her bowel's work, having a BM daily.  OBJECTIVE  Changes in: Posture/Observations:    Range of Motion/Flexibilty:    Strength/MMT:  LE MMT:  Pelvic floor:  Abdominal:   Palpation:  Gait Analysis:  INTERVENTIONS THIS SESSION: Dry-needle: Performed TPDN with a .30x75mm needle and standard approach as described below to decrease spasm and pain and allow for improved balance of musculature for improved function and decreased symptoms.   Manual: TP release and STM to B paraspinals near T/L junction to decrease spasm and pain and allow for improved balance of musculature, decreased pressure on nerve roots near the T/L junction, and decreased symptoms.   Self-care: Educated on and practiced ILY massage, bowel retraining, discussed use of mirilax, and showed child's pose stretch to improve motility and help establish a bowel routine.  Total time: 60 min.                     Trigger  Point Dry Needling - 07/23/19 0001    Consent Given?  Yes    Education Handout Provided  No    Muscles Treated Back/Hip  Erector spinae;Lumbar multifidi;Thoracic multifidi    Dry Needling Comments  Bilateral    Erector spinae Response  Twitch response elicited;Palpable increased muscle length    Lumbar multifidi Response  Twitch response elicited;Palpable increased muscle length    Thoracic multifidi response  Twitch response elicited;Palpable increased muscle length             PT Short  Term Goals - 06/20/19 1417      PT SHORT TERM GOAL #1   Title  Patient will demonstrate improved pelvic alignment and balance of musculature surrounding the pelvis to facilitate decreased PFM spasms and decrease pelvic pain.    Baseline  L up-slip and Anterior rotation, spasms through posterior hip, Iliacus, and back    Time  5    Period  Weeks    Status  New    Target Date  07/25/19      PT SHORT TERM GOAL #2   Title  Patient will demonstrate a coordinated contraction, relaxation, and bulge of the pelvic floor muscles to demonstrate functional recruitment and motion and allow for further strengthening.    Baseline  Pt. Hx. suggests PFM spasms and poor coordination    Time  5    Period  Weeks    Status  New    Target Date  07/25/19      PT SHORT TERM GOAL #3   Title  Patient will demonstrate HEP x1 in the clinic to demonstrate understanding and proper form to allow for further improvement.    Baseline  Pt. lacks knowledge of therapeutic exercise that will decrease Sx.    Time  5    Period  Weeks    Status  New    Target Date  07/25/19        PT Long Term Goals - 06/20/19 1441      PT LONG TERM GOAL #1   Title  Patient will report no episodes of SUI over the course of the prior two weeks to demonstrate improved functional ability.    Baseline  Pt. having SUI with running, jumping, sneezing that fills a pad    Time  10    Period  Weeks    Status  New    Target Date  08/29/19      PT LONG TERM GOAL #2   Title  Patient will describe pain no greater than 2/10 over two weeks to demonstrate improved functional ability.    Baseline  Max of 8/10    Time  5    Period  Weeks    Status  New    Target Date  07/25/19      PT LONG TERM GOAL #3   Title  Patient will report having BM's at least every-other day with consistency between [facility] stool scale 3-5 over the prior week to demonstrate decreased constipation.    Baseline  Pt. goes 3-4 days between BM's, slow motility.    Time   10    Period  Days    Status  New    Target Date  08/29/19      PT LONG TERM GOAL #4   Title  Pt. will demonstrate an improvement in FOTO score by 10 points in each category to demonstrate improved function.    Baseline  FOTO PFDI Bowel:  17, Urinary problem:  52, Bowel Cnst: 43, PFDI Urinary: 29    Time  10    Period  Weeks    Status  New    Target Date  08/29/19            Plan - 07/23/19 1010    Clinical Impression Statement  Pt. Responded well to all interventions today, demonstrating decreased spasms near T/L junction as well as understanding and correct performance of all education and exercises provided today. They will continue to benefit from skilled physical therapy to work toward remaining goals and maximize function as well as decrease likelihood of symptom increase or recurrence.     PT Next Visit Plan  Aseess PFM and treat PRN    PT Home Exercise Plan  ILY massage, bowel retraining, mirilax, Child's pose stretch    Consulted and Agree with Plan of Care  Patient       Patient will benefit from skilled therapeutic intervention in order to improve the following deficits and impairments:     Visit Diagnosis: Other idiopathic scoliosis, thoracolumbar region  Sacrococcygeal disorders, not elsewhere classified  Other muscle spasm     Problem List There are no problems to display for this patient.  Cleophus Molt DPT, ATC Cleophus Molt 07/23/2019, 10:11 AM  [place] [facility] MAIN [facility] SERVICES [address] [place], [place], [address] Phone: [phone]   Fax:  [phone]  Name: Tina Fowler MRN: [id number] Date of Birth: January 06, 1976

## 2019-07-22 ENCOUNTER — Other Ambulatory Visit: Payer: Self-pay

## 2019-07-22 ENCOUNTER — Ambulatory Visit: Payer: Managed Care, Other (non HMO) | Attending: Obstetrics & Gynecology

## 2019-07-22 DIAGNOSIS — M62838 Other muscle spasm: Secondary | ICD-10-CM | POA: Insufficient documentation

## 2019-07-22 DIAGNOSIS — M533 Sacrococcygeal disorders, not elsewhere classified: Secondary | ICD-10-CM | POA: Insufficient documentation

## 2019-07-22 DIAGNOSIS — M4125 Other idiopathic scoliosis, thoracolumbar region: Secondary | ICD-10-CM | POA: Insufficient documentation

## 2019-07-22 NOTE — Therapy (Signed)
[place] MAIN [facility] SERVICES [address] [place], [place], [address] Phone: [phone]   Fax:  [phone]  Physical Therapy Treatment  The patient has been informed of current processes in place at Outpatient Rehab to protect patients from Covid-19 exposure including social distancing, schedule modifications, and new cleaning procedures. After discussing their particular risk with a therapist based on the patient's personal risk factors, the patient has decided to proceed with in-person therapy.   Patient Details  Name: Tina Fowler MRN: [id number] Date of Birth: 1975-07-01 No data recorded  Encounter Date: 07/22/2019  PT End of Session - 07/23/19 1016    Visit Number  3    Number of Visits  10    Date for PT Re-Evaluation  08/29/19    Authorization Type  [employer]    Authorization - Visit Number  3    Authorization - Number of Visits  10    Progress Note Due on Visit  10    PT Start Time  8756    PT Stop Time  4332    PT Time Calculation (min)  60 min    Activity Tolerance  Patient tolerated treatment well;No increased pain    Behavior During Therapy  WFL for tasks assessed/performed       Past Medical History:  Diagnosis Date  . ADD (attention deficit disorder) without hyperactivity   . Depression   . Endometriosis of uterus   . Hypothyroidism   . Positive TB test   . Raynaud's disease     Past Surgical History:  Procedure Laterality Date  . CERVICAL CONE BIOPSY    . COLONOSCOPY    . COLONOSCOPY WITH PROPOFOL N/A 07/07/2016   Procedure: COLONOSCOPY WITH PROPOFOL;  Surgeon: Lollie Sails, MD;  Location: [facility] ENDOSCOPY;  Service: Endoscopy;  Laterality: N/A;  . DIAGNOSTIC LAPAROSCOPY     Pelvic (endometriosis)  . ESOPHAGOGASTRODUODENOSCOPY      There were no vitals filed for this visit.   Pelvic Floor Physical Therapy Treatment Note  SCREENING  Changes in medications, allergies, or medical history?:  no    SUBJECTIVE  Patient reports: Things just aren't moving still.  Precautions:  ADD, Depression, Endometriosis, hypothyroidism, Raynaud's  Sexual activity/pain: Sometimes is uncomfortable because of pressure/bloating/bladder full feeling.   Location of pain: upper to low back worsens during menstrual cycle Current pain: 0/10 (tight)  Max pain: 8/10 Least pain: 0/10 Nature of pain:Sharp and dull  Patient Goals: Having her bowel's work, having a BM daily.  OBJECTIVE  Changes in: Posture/Observations:  Pt. Is very tired today, slouched forward more than usual.  Range of Motion/Flexibilty:    Strength/MMT:  LE MMT:   Pelvic Floor External Exam: Introitus Appears: mildly gaping Skin integrity: darkened skin surrounding vulva Palpation: no TTP Cough: parodoxical Prolapse visible?: no Scar mobility: WNL ~ 8 o'clock   Internal Vaginal Exam: Strength (PERF): 3/5 5 sec (cue to stop flow of urine" Symmetry: L>R for TTP Palpation: TTP throughout, wors at anterior PR/PC and coccygeus B. Prolapse: mild anterior wall~ 1 cm above introitus   Internal Rectal Exam: Deferred indefinately Strength (PERF): Symmetry: Palpation: Prolapse:   Abdominal:   Palpation: No TTP to B adductors.  Gait Analysis:  INTERVENTIONS THIS SESSION: Manual: assessed PFM and performed TP release B to decrease spasm and improve function/recruitment.   NM re-ed: Educated on how to squeeze, release, and bulge PFM using elevator metaphor, picking blueberries, and a blooming flower. Pt. Demonstrated paradoxical recruitment with  breathing but responded to cueing to "stop flow of urine" appropriately though she bulged when trying to "squeeze".  Total time: 60 min.                            PT Short Term Goals - 06/20/19 1417      PT SHORT TERM GOAL #1   Title  Patient will demonstrate improved pelvic alignment and balance of musculature surrounding the  pelvis to facilitate decreased PFM spasms and decrease pelvic pain.    Baseline  L up-slip and Anterior rotation, spasms through posterior hip, Iliacus, and back    Time  5    Period  Weeks    Status  New    Target Date  07/25/19      PT SHORT TERM GOAL #2   Title  Patient will demonstrate a coordinated contraction, relaxation, and bulge of the pelvic floor muscles to demonstrate functional recruitment and motion and allow for further strengthening.    Baseline  Pt. Hx. suggests PFM spasms and poor coordination    Time  5    Period  Weeks    Status  New    Target Date  07/25/19      PT SHORT TERM GOAL #3   Title  Patient will demonstrate HEP x1 in the clinic to demonstrate understanding and proper form to allow for further improvement.    Baseline  Pt. lacks knowledge of therapeutic exercise that will decrease Sx.    Time  5    Period  Weeks    Status  New    Target Date  07/25/19        PT Long Term Goals - 06/20/19 1441      PT LONG TERM GOAL #1   Title  Patient will report no episodes of SUI over the course of the prior two weeks to demonstrate improved functional ability.    Baseline  Pt. having SUI with running, jumping, sneezing that fills a pad    Time  10    Period  Weeks    Status  New    Target Date  08/29/19      PT LONG TERM GOAL #2   Title  Patient will describe pain no greater than 2/10 over two weeks to demonstrate improved functional ability.    Baseline  Max of 8/10    Time  5    Period  Weeks    Status  New    Target Date  07/25/19      PT LONG TERM GOAL #3   Title  Patient will report having BM's at least every-other day with consistency between [facility] stool scale 3-5 over the prior week to demonstrate decreased constipation.    Baseline  Pt. goes 3-4 days between BM's, slow motility.    Time  10    Period  Days    Status  New    Target Date  08/29/19      PT LONG TERM GOAL #4   Title  Pt. will demonstrate an improvement in FOTO score by 10  points in each category to demonstrate improved function.    Baseline  FOTO PFDI Bowel:  17, Urinary problem: 52, Bowel Cnst: 43, PFDI Urinary: 29    Time  10    Period  Weeks    Status  New    Target Date  08/29/19  Plan - 07/23/19 1017    Clinical Impression Statement  Pt. Responded well to all interventions today, demonstrating improved coordination of PFM with verbal and tactile cues and increased recruitment from 3/5 MMT to 4/5MMT when "picking blueberries" with exhale as well as understanding and correct performance of all education and exercises provided today. They will continue to benefit from skilled physical therapy to work toward remaining goals and maximize function as well as decrease likelihood of symptom increase or recurrence.     PT Next Visit Plan  review coordination of deep core and re-assess internally/ continue coordination and strengthening PRN. teach splinting PRN?    PT Home Exercise Plan  ILY massage, bowel retraining, mirilax, Child's pose stretch, Anti-kegels    Consulted and Agree with Plan of Care  Patient       Patient will benefit from skilled therapeutic intervention in order to improve the following deficits and impairments:     Visit Diagnosis: Other idiopathic scoliosis, thoracolumbar region  Sacrococcygeal disorders, not elsewhere classified  Other muscle spasm     Problem List There are no problems to display for this patient.  Cleophus Molt DPT, ATC Cleophus Molt 07/23/2019, 10:43 AM  [place] [facility] MAIN [facility] SERVICES [address] [place], [place], [address] Phone: [phone]   Fax:  [phone]  Name: DILANA MCPHIE MRN: [id number] Date of Birth: 1975/06/23

## 2019-07-22 NOTE — Patient Instructions (Signed)
Anti- Kegel exercises:     Breathe in and picture a balloon inflating, breathe out and "stop the flow of urine" "pick the blueberries, finally, relax all the way and then think down and out (flower blooming) to mimick what it should feel like when you have a BM>  If this is difficult, start by performing while lying down as shown. As you get stronger, you can try in sitting, standing, etc. Make sure that you are NOT contracting your buttocks or inner thigh muscles!  Tip: If you are having a hard time feeling the muscles, try using a hand-held mirror to observe the muscles lift and draw in or feel with your hand to make sure you are squeezing when you think you are squeezing, not bearing down. Some people find it helpful to pretend you are "picking blueberries" with the vagina to feel the drawing in or try to make the clitoris "nod" and pull your sitz bones in toward the middle.

## 2019-07-29 ENCOUNTER — Ambulatory Visit: Payer: Managed Care, Other (non HMO)

## 2019-08-05 ENCOUNTER — Ambulatory Visit: Payer: Managed Care, Other (non HMO)

## 2019-08-05 ENCOUNTER — Other Ambulatory Visit: Payer: Self-pay

## 2019-08-05 DIAGNOSIS — M4125 Other idiopathic scoliosis, thoracolumbar region: Secondary | ICD-10-CM | POA: Diagnosis not present

## 2019-08-05 DIAGNOSIS — M533 Sacrococcygeal disorders, not elsewhere classified: Secondary | ICD-10-CM

## 2019-08-05 DIAGNOSIS — M62838 Other muscle spasm: Secondary | ICD-10-CM

## 2019-08-05 NOTE — Therapy (Signed)
[facility] MAIN [facility] SERVICES [address] [place], [place], [address] Phone: [phone]   Fax:  [phone]  Physical Therapy Treatment  The patient has been informed of current processes in place at Outpatient Rehab to protect patients from Covid-19 exposure including social distancing, schedule modifications, and new cleaning procedures. After discussing their particular risk with a therapist based on the patient's personal risk factors, the patient has decided to proceed with in-person therapy.   Patient Details  Name: Tina Fowler MRN: [id number] Date of Birth: May 27, 1975 No data recorded  Encounter Date: 08/05/2019  PT End of Session - 08/12/19 2355    Visit Number  4    Number of Visits  10    Date for PT Re-Evaluation  08/29/19    Authorization Type  [employer]    Authorization - Visit Number  4    Authorization - Number of Visits  10    Progress Note Due on Visit  10    PT Start Time  1608    PT Stop Time  1708    PT Time Calculation (min)  60 min    Activity Tolerance  Patient tolerated treatment well;No increased pain    Behavior During Therapy  WFL for tasks assessed/performed       Past Medical History:  Diagnosis Date  . ADD (attention deficit disorder) without hyperactivity   . Depression   . Endometriosis of uterus   . Hypothyroidism   . Positive TB test   . Raynaud's disease     Past Surgical History:  Procedure Laterality Date  . CERVICAL CONE BIOPSY    . COLONOSCOPY    . COLONOSCOPY WITH PROPOFOL N/A 07/07/2016   Procedure: COLONOSCOPY WITH PROPOFOL;  Surgeon: Christena Deem, MD;  Location: [facility] ENDOSCOPY;  Service: Endoscopy;  Laterality: N/A;  . DIAGNOSTIC LAPAROSCOPY     Pelvic (endometriosis)  . ESOPHAGOGASTRODUODENOSCOPY      There were no vitals filed for this visit.   Pelvic Floor Physical Therapy Treatment Note  SCREENING  Changes in medications, allergies, or medical history?: has started gut  assist for leaky gut.   SUBJECTIVE  Patient reports: She is not getting the urge to go, has to use something to get it out. Thinks issue is at the exit. She uses soap which she inserts to stimulate BM, BM happens ~ 1 hour after stimulation usually.   Precautions:  ADD, Depression, Endometriosis, hypothyroidism, Raynaud's  Sexual activity/pain: Sometimes is uncomfortable because of pressure/bloating/bladder full feeling.   Location of pain: upper to low back worsens during menstrual cycle Current pain: 0/10 (tight)  Max pain: 8/10 Least pain: 0/10 Nature of pain:Sharp and dull  Patient Goals: Having her bowel's work, having a BM daily.  OBJECTIVE  Changes in: Posture/Observations:  Pt. Is very tired today, slouched forward more than usual.  Range of Motion/Flexibilty:    Strength/MMT:  LE MMT:   Pelvic Floor External Exam: Introitus Appears: mildly gaping Skin integrity: darkened skin surrounding vulva Palpation: no TTP Cough: parodoxical Prolapse visible?: no Scar mobility: WNL ~ 8 o'clock   Internal Vaginal Exam: Strength (PERF): 3/5 5 sec (cue to stop flow of urine" Symmetry: L>R for TTP Palpation: TTP throughout, worst at anterior PR/PC and coccygeus B. Prolapse: mild anterior wall~ 1 cm above introitus   Internal Rectal Exam: Deferred indefinately Strength (PERF): 5/5 Symmetry: symmetrical Palpation: TTP at 6 o'clock near IAS>EAS Prolapse: none   Abdominal:   Palpation: No TTP to B  adductors.  Gait Analysis:  INTERVENTIONS THIS SESSION: Manual: assessed PFM rectally and performed TP release to IAS and PR/PC near ~ 6 o'clock  to decrease spasm and improve function/recruitment and increase ease with BM.   NM re-ed: Educated on toileting techniques and POC with rectal release Vs. T/L junction work to determine why unable to empty.  Total time: 60 min.                             PT Short Term Goals - 06/20/19  1417      PT SHORT TERM GOAL #1   Title  Patient will demonstrate improved pelvic alignment and balance of musculature surrounding the pelvis to facilitate decreased PFM spasms and decrease pelvic pain.    Baseline  L up-slip and Anterior rotation, spasms through posterior hip, Iliacus, and back    Time  5    Period  Weeks    Status  New    Target Date  07/25/19      PT SHORT TERM GOAL #2   Title  Patient will demonstrate a coordinated contraction, relaxation, and bulge of the pelvic floor muscles to demonstrate functional recruitment and motion and allow for further strengthening.    Baseline  Pt. Hx. suggests PFM spasms and poor coordination    Time  5    Period  Weeks    Status  New    Target Date  07/25/19      PT SHORT TERM GOAL #3   Title  Patient will demonstrate HEP x1 in the clinic to demonstrate understanding and proper form to allow for further improvement.    Baseline  Pt. lacks knowledge of therapeutic exercise that will decrease Sx.    Time  5    Period  Weeks    Status  New    Target Date  07/25/19        PT Long Term Goals - 06/20/19 1441      PT LONG TERM GOAL #1   Title  Patient will report no episodes of SUI over the course of the prior two weeks to demonstrate improved functional ability.    Baseline  Pt. having SUI with running, jumping, sneezing that fills a pad    Time  10    Period  Weeks    Status  New    Target Date  08/29/19      PT LONG TERM GOAL #2   Title  Patient will describe pain no greater than 2/10 over two weeks to demonstrate improved functional ability.    Baseline  Max of 8/10    Time  5    Period  Weeks    Status  New    Target Date  07/25/19      PT LONG TERM GOAL #3   Title  Patient will report having BM's at least every-other day with consistency between [facility] stool scale 3-5 over the prior week to demonstrate decreased constipation.    Baseline  Pt. goes 3-4 days between BM's, slow motility.    Time  10    Period  Days     Status  New    Target Date  08/29/19      PT LONG TERM GOAL #4   Title  Pt. will demonstrate an improvement in FOTO score by 10 points in each category to demonstrate improved function.    Baseline  FOTO PFDI Bowel:  17, Urinary  problem: 52, Bowel Cnst: 43, PFDI Urinary: 29    Time  10    Period  Weeks    Status  New    Target Date  08/29/19            Plan - 08/12/19 3846    Clinical Impression Statement  Pt. Responded well to all interventions today, demonstrating coordinated squeeze, relax, and bulge of PFM rectally and decreased IAS and PR/PC spasm, as well as understanding and correct performance of all education and exercises provided today. They will continue to benefit from skilled physical therapy to work toward remaining goals and maximize function as well as decrease likelihood of symptom increase or recurrence.     PT Next Visit Plan  re-assess T/L junction, review coordination of deep core and re-assess internally(for posterior wall ESP / continue coordination and strengthening PRN. teach splinting PRN?    PT Home Exercise Plan  ILY massage, bowel retraining, mirilax, Child's pose stretch, Anti-kegels    Consulted and Agree with Plan of Care  Patient       Patient will benefit from skilled therapeutic intervention in order to improve the following deficits and impairments:     Visit Diagnosis: Other idiopathic scoliosis, thoracolumbar region  Sacrococcygeal disorders, not elsewhere classified  Other muscle spasm     Problem List There are no problems to display for this patient.  Willa Rough DPT, ATC Willa Rough 08/12/2019, 9:48 AM  [place] MAIN [facility] SERVICES [address] [place], [place], [address] Phone: [phone]   Fax:  [phone]  Name: Tina Fowler MRN: [id number] Date of Birth: 1975/06/04

## 2019-08-05 NOTE — Patient Instructions (Signed)
Toileting Techniques for Bowel Movements (Defecation) Using your belly (abdomen) and pelvic floor muscles to have a bowel movement is usually instinctive.  Sometimes people can have problems with these muscles and have to relearn proper defecation (emptying) techniques.  If you have weakness in your muscles, organs that are falling out, decreased sensation in your pelvis, or ignore your urge to go, you may find yourself straining to have a bowel movement.  You are straining if you are: . holding your breath or taking in a huge gulp of air and holding it  . keeping your lips and jaw tensed and closed tightly . turning red in the face because of excessive pushing or forcing . developing or worsening your  hemorrhoids . getting faint while pushing . not emptying completely and have to defecate many times a day  If you are straining, you are actually making it harder for yourself to have a bowel movement.  Many people find they are pulling up with the pelvic floor muscles and closing off instead of opening the anus. Due to lack pelvic floor relaxation and coordination the abdominal muscles, one has to work harder to push the feces out.  Many people have never been taught how to defecate efficiently and effectively.  Notice what happens to your body when you are having a bowel movement.  While you are sitting on the toilet pay attention to the following areas: . Jaw and mouth position . Angle of your hips   . Whether your feet touch the ground or not . Arm placement  . Spine position . Waist . Belly tension . Anus (opening of the anal canal)  An Evacuation/Defecation Plan   Here are the 4 basic points:  1. Lean forward enough for your elbows to rest on your knees 2. Support your feet on the floor or use a low stool if your feet don't touch the floor  3. Push out your belly as if you have swallowed a beach ball--you should feel a widening of your waist 4. Open and relax your pelvic floor  muscles, rather than tightening around the anus       The following conditions my require modifications to your toileting posture:  . If you have had surgery in the past that limits your back, hip, pelvic, knee or ankle flexibility . Constipation   Your healthcare practitioner may make the following additional suggestions and adjustments:  1) Sit on the toilet  a) Make sure your feet are supported. b) Notice your hip angle and spine position--most people find it effective to lean forward or raise their knees, which can help the muscles around the anus to relax  c) When you lean forward, place your forearms on your thighs for support  2) Relax suggestions a) Breath deeply in through your nose and out slowly through your mouth as if you are smelling the flowers and blowing out the candles. b) To become aware of how to relax your muscles, contracting and releasing muscles can be helpful.  Pull your pelvic floor muscles in tightly by using the image of holding back gas, or closing around the anus (visualize making a circle smaller) and lifting the anus up and in.  Then release the muscles and your anus should drop down and feel open. Repeat 5 times ending with the feeling of relaxation. c) Keep your pelvic floor muscles relaxed; let your belly bulge out. d) The digestive tract starts at the mouth and ends at the anal opening, so  be sure to relax both ends of the tube.  Place your tongue on the roof of your mouth with your teeth separated.  This helps relax your mouth and will help to relax the anus at the same time.  3) Empty (defecation) a) Keep your pelvic floor and sphincter relaxed, then bulge your anal muscles.  Make the anal opening wide.  b) Stick your belly out as if you have swallowed a beach ball. c) Make your belly wall hard using your belly muscles while continuing to breathe. Doing this makes it easier to open your anus. d) Breath out and give a grunt (or try using other sounds  such as ahhhh, shhhhh, ohhhh or grrrrrrr).  4) Finish a) As you finish your bowel movement, pull the pelvic floor muscles up and in.  This will leave your anus in the proper place rather than remaining pushed out and down. If you leave your anus pushed out and down, it will start to feel as though that is normal and give you incorrect signals about needing to have a bowel movement.  This meditation is from Shelly Prosko, physical therapist and yoga instructor.  Patients, yoga students and pelvic health practitioners that I have shared this with have seemed to really enjoy it, find it easy to practice and teach, and report its usefulness! It involves 6 stages, using the acronym "AIRBAG" to help you remember! If you are sitting on the toilet, it is a good idea to place your feet up on some blocks so that your knees are slightly higher than your hips. This position can help enhance your PFMs to relax and allow for proper elimination, particularly for bowel movements. If you are standing during urination, these toilet meditation stages of "AIRBAG" can still be performed: A = Awareness: become present and connected to your body as best as you can. A brief body scan from head to toe simply observing sensations that you may be experiencing, without judgement, both internally (interoceptive awareness) and externally (such as the sensations at the soles of the feet weight bearing on the supporting surface, or sensations at the thighs as they weight bear on the toilet seat, or how you are holding your arms, or any tension in the jaw or shoulders). You may include awareness of thoughts and emotions, without elaborating on a story or analyzing. I = Imagination: use your visualization skills to imagine your pelvic floor and the general area of attachments of the PFMs to the inside of the front, sides and back of the pelvis, the tailbone and sacrum. Visualize where the bladder and bowel are positioned and imagine them  emptying and that the PFMs spanning across the pelvic floor are healthy and functioning optimally. R = Release & Relax: let go of any tension in the PFMs as best as you can. Releasing and relaxing these muscles can sometimes be difficult for a variety of reasons. Sometimes 'trying too hard' to relax and let go creates even more tension. Be patient and compassionate towards yourself if you have trouble with this. Letting go often takes courage, trust, concentration and practise. B = Breathe: allow your natural breath pattern to emerge. Sometimes when we try to breathe, we create more tension that results in unnatural patterns that do not serve a relaxed state. As you quietly inhale, the belly will naturally protrude outwards or forward and the pelvic floor will descend. As you exhale, the belly and PFMs will return to their resting positions. During toileting, see if   you can simply allow the quiet rhythm of the abdomino-pelvic diaphragmatic breath to happen on its own without trying to change it. A = Allow: this 'allowing' stage is a little more than just releasing and relaxing or allowing the breath to happen on its own. See if you can really give yourself permission to trust that your body knows what to do and when to do it. Perhaps you feel the need to push gently (do not strain) or you feel like you want to take a deep breath, sigh out loud, lean forward, or place your feet in a different position. The more refined your awareness skills are, the more you can trust what feels right, and not always what you think you should do. G = Gratitude: I think it is a healthy practice to not only be completely present and mindful when toileting, but to also honour this sophisticated and truly complex function that our body does for us on a daily basis without us even asking it to. So each time you complete your toileting event, I invite you to send a little gratitude to your body and all its incredibly phenomenal parts  to end your toilet meditation ! There is a FREE bonus feature of the full guided Toilet Meditation (with music and breathtaking cinematography) included along with the "Creating Pelvic Floor Health with PhysioYoga" practice sessions which consist of a combination of physical therapy based exercises and yoga methods targeted to optimize pelvic floor health. I also have a free brief segment of the meditation on my YouTube channel.    

## 2019-08-12 ENCOUNTER — Ambulatory Visit: Payer: Managed Care, Other (non HMO)

## 2019-08-19 ENCOUNTER — Ambulatory Visit: Payer: Managed Care, Other (non HMO)

## 2019-08-26 ENCOUNTER — Ambulatory Visit: Payer: Managed Care, Other (non HMO)

## 2019-09-02 ENCOUNTER — Ambulatory Visit: Payer: Managed Care, Other (non HMO) | Attending: Obstetrics & Gynecology

## 2019-09-02 ENCOUNTER — Other Ambulatory Visit: Payer: Self-pay

## 2019-09-02 DIAGNOSIS — M4125 Other idiopathic scoliosis, thoracolumbar region: Secondary | ICD-10-CM | POA: Diagnosis not present

## 2019-09-02 DIAGNOSIS — M533 Sacrococcygeal disorders, not elsewhere classified: Secondary | ICD-10-CM | POA: Diagnosis present

## 2019-09-02 DIAGNOSIS — M62838 Other muscle spasm: Secondary | ICD-10-CM | POA: Insufficient documentation

## 2019-09-02 NOTE — Therapy (Signed)
[place] MAIN [facility] SERVICES [address] [place], [place], [address] Phone: [phone]   Fax:  [phone]  Physical Therapy Treatment  The patient has been informed of current processes in place at Outpatient Rehab to protect patients from Covid-19 exposure including social distancing, schedule modifications, and new cleaning procedures. After discussing their particular risk with a therapist based on the patient's personal risk factors, the patient has decided to proceed with in-person therapy.   Patient Details  Name: Tina Fowler MRN: [id number] Date of Birth: 1975/07/14 No data recorded  Encounter Date: 09/02/2019  PT End of Session - 09/04/19 0940    Visit Number  5    Number of Visits  10    Date for PT Re-Evaluation  08/29/19    Authorization Type  [employer]    Authorization - Visit Number  5    Authorization - Number of Visits  10    Progress Note Due on Visit  10    PT Start Time  1600    PT Stop Time  1700    PT Time Calculation (min)  60 min    Activity Tolerance  Patient tolerated treatment well;No increased pain    Behavior During Therapy  WFL for tasks assessed/performed       Past Medical History:  Diagnosis Date  . ADD (attention deficit disorder) without hyperactivity   . Depression   . Endometriosis of uterus   . Hypothyroidism   . Positive TB test   . Raynaud's disease     Past Surgical History:  Procedure Laterality Date  . CERVICAL CONE BIOPSY    . COLONOSCOPY    . COLONOSCOPY WITH PROPOFOL N/A 07/07/2016   Procedure: COLONOSCOPY WITH PROPOFOL;  Surgeon: Lollie Sails, MD;  Location: [facility] ENDOSCOPY;  Service: Endoscopy;  Laterality: N/A;  . DIAGNOSTIC LAPAROSCOPY     Pelvic (endometriosis)  . ESOPHAGOGASTRODUODENOSCOPY      There were no vitals filed for this visit.   Pelvic Floor Physical Therapy Treatment Note  SCREENING  Changes in medications, allergies, or medical history?: has started gut  assist for leaky gut.   SUBJECTIVE  Patient reports: Has not noticed much improvement. Still having to use soap which only works every now and then. Has tried bowel retraining in the evenings because she does not have time in the morning, tried 3 days in a row without success. Stool is still soft, not hard. Gets a sharp pain when she sneezes in the LLQ  Precautions:  ADD, Depression, Endometriosis, hypothyroidism, Raynaud's  Sexual activity/pain: Sometimes is uncomfortable because of pressure/bloating/bladder full feeling.   Location of pain: upper to low back worsens during menstrual cycle Current pain: 0/10 (tight)  Max pain: 7/10 Least pain: 0/10 Nature of pain:Sharp and dull  Patient Goals: Having her bowel's work, having a BM daily.  OBJECTIVE  Changes in: Posture/Observations:  Mild hyperordosis  Range of Motion/Flexibilty:   Strength/MMT:  LE MMT:   Pelvic Floor  (From previous session) [External Exam: Introitus Appears: mildly gaping Skin integrity: darkened skin surrounding vulva Palpation: no TTP Cough: parodoxical Prolapse visible?: no Scar mobility: WNL ~ 8 o'clock   Internal Vaginal Exam: Strength (PERF): 3/5 5 sec (cue to stop flow of urine" Symmetry: L>R for TTP Palpation: TTP throughout, worst at anterior PR/PC and coccygeus B. Prolapse: mild anterior wall~ 1 cm above introitus   Internal Rectal Exam:  Strength (PERF): 5/5 Symmetry: symmetrical Palpation: TTP at 6 o'clock near IAS>EAS Prolapse:  none]   Abdominal:   Palpation: No TTP to B Iliacus, Lumbar erector spinae and multifidus  Gait Analysis:  INTERVENTIONS THIS SESSION: Manual: Performed TP release and STM to B Iliacus, erector spinae and multifidus through L1-4 to decrease spasm and pain and allow for improved balance of musculature for improved function and decreased symptoms.  Therex: Educated on and practiced hip-flexor stretch in proposal pose To maintain and  improve muscle length and allow for improved balance of musculature for long-term symptom relief.  Dry-needle: Performed TPDN with a .30x163mm needle to hip-flexors and with 4 .30x36mm needles with e-stim to lumbar erector spinae and multifidus using standard approach as described below to decrease spasm and pain and allow for improved balance of musculature for improved function and decreased symptoms.  E-stim: Performed TPDN with E-stim to lumbar multifidus and erector spinae to try to improve timing/flow of neurological information through the L1-4 nerve roots to decrease constipation and pain    Total time: 60 min.                       Trigger Point Dry Needling - 09/04/19 0001    Consent Given?  Yes    Education Handout Provided  No    Muscles Treated Back/Hip  Iliacus;Erector spinae;Lumbar multifidi    Dry Needling Comments  bilateral    Electrical Stimulation Performed with Dry Needling  Yes    E-stim with Dry Needling Details  Frequency: 2, muscle contraction intensity, 15 min.    Iliacus Response  Twitch response elicited;Palpable increased muscle length    Erector spinae Response  Twitch response elicited;Palpable increased muscle length    Lumbar multifidi Response  Twitch response elicited;Palpable increased muscle length    Thoracic multifidi response  --             PT Short Term Goals - 06/20/19 1417      PT SHORT TERM GOAL #1   Title  Patient will demonstrate improved pelvic alignment and balance of musculature surrounding the pelvis to facilitate decreased PFM spasms and decrease pelvic pain.    Baseline  L up-slip and Anterior rotation, spasms through posterior hip, Iliacus, and back    Time  5    Period  Weeks    Status  New    Target Date  07/25/19      PT SHORT TERM GOAL #2   Title  Patient will demonstrate a coordinated contraction, relaxation, and bulge of the pelvic floor muscles to demonstrate functional recruitment and motion and  allow for further strengthening.    Baseline  Pt. Hx. suggests PFM spasms and poor coordination    Time  5    Period  Weeks    Status  New    Target Date  07/25/19      PT SHORT TERM GOAL #3   Title  Patient will demonstrate HEP x1 in the clinic to demonstrate understanding and proper form to allow for further improvement.    Baseline  Pt. lacks knowledge of therapeutic exercise that will decrease Sx.    Time  5    Period  Weeks    Status  New    Target Date  07/25/19        PT Long Term Goals - 06/20/19 1441      PT LONG TERM GOAL #1   Title  Patient will report no episodes of SUI over the course of the prior two weeks to demonstrate improved functional  ability.    Baseline  Pt. having SUI with running, jumping, sneezing that fills a pad    Time  10    Period  Weeks    Status  New    Target Date  08/29/19      PT LONG TERM GOAL #2   Title  Patient will describe pain no greater than 2/10 over two weeks to demonstrate improved functional ability.    Baseline  Max of 8/10    Time  5    Period  Weeks    Status  New    Target Date  07/25/19      PT LONG TERM GOAL #3   Title  Patient will report having BM's at least every-other day with consistency between [facility] stool scale 3-5 over the prior week to demonstrate decreased constipation.    Baseline  Pt. goes 3-4 days between BM's, slow motility.    Time  10    Period  Days    Status  New    Target Date  08/29/19      PT LONG TERM GOAL #4   Title  Pt. will demonstrate an improvement in FOTO score by 10 points in each category to demonstrate improved function.    Baseline  FOTO PFDI Bowel:  17, Urinary problem: 52, Bowel Cnst: 43, PFDI Urinary: 29    Time  10    Period  Weeks    Status  New    Target Date  08/29/19            Plan - 09/04/19 0942    Clinical Impression Statement  Pt. Responded well to all interventions today, demonstrating decreased spasm and TTP as well as understanding and correct performance  of all education and exercises provided today. They will continue to benefit from skilled physical therapy to work toward remaining goals and maximize function as well as decrease likelihood of symptom increase or recurrence.     PT Next Visit Plan  re-assess T/L junction, review coordination of deep core and re-assess internally(for posterior wall ESP / continue coordination and strengthening PRN. teach splinting PRN?    PT Home Exercise Plan  ILY massage, bowel retraining, mirilax, Child's pose stretch, Anti-kegels, hip-flexor stretch    Consulted and Agree with Plan of Care  Patient       Patient will benefit from skilled therapeutic intervention in order to improve the following deficits and impairments:     Visit Diagnosis: Other idiopathic scoliosis, thoracolumbar region  Sacrococcygeal disorders, not elsewhere classified  Other muscle spasm     Problem List There are no problems to display for this patient.  Cleophus Molt DPT, ATC Cleophus Molt 09/04/2019, 9:49 AM  [facility] MAIN [facility] SERVICES [address] [place], [place], [address] Phone: [phone]   Fax:  [phone]  Name: Tina Fowler MRN: [id number] Date of Birth: [birth year]

## 2019-09-02 NOTE — Patient Instructions (Signed)
Flexors, Lunge  Hip Flexor Stretch: Proposal Pose    Maintain pelvic tuck under, lift pubic bone toward navel. Engage posterior hip muscles (firm glute muscles of leg in back position) and shift forward until you feel stretch on front of leg that is down. To increase stretch, maintain balance and ease hips forward. You may use one hand on a chair for balance if needed. Hold for __5__ breaths. Repeat __2-3__ times each leg.  Do _1-2__ times per day.   

## 2019-09-09 ENCOUNTER — Other Ambulatory Visit: Payer: Self-pay

## 2019-09-09 ENCOUNTER — Ambulatory Visit: Payer: Managed Care, Other (non HMO)

## 2019-09-09 DIAGNOSIS — M4125 Other idiopathic scoliosis, thoracolumbar region: Secondary | ICD-10-CM

## 2019-09-09 DIAGNOSIS — M533 Sacrococcygeal disorders, not elsewhere classified: Secondary | ICD-10-CM

## 2019-09-09 DIAGNOSIS — M62838 Other muscle spasm: Secondary | ICD-10-CM

## 2019-09-09 NOTE — Patient Instructions (Signed)
    This is The QL muscle  To perform release on this muscle, start by getting into this position by bridging the hips up and then slowly lowering your back, then your butt down to lengthen the low back then put the ball under you where you feel the tender spot and roll to the same side slightly to add pressure as needed. Hold still and take deep breaths until the pain is at least 50% less or, ideally, just pressure.   This is your piriformis    To release this muscle start in this position with your ankle crossed over the opposite knee. Place the tennis ball under your buttock where the tender spot is and then slightly roll your weight to the same side to put just enough pressure that it is uncomfortable. Hold and take deep breaths until the pain is at least 50% less or, ideally ,just pressure.   This is your Gluteus Medius and Minimus  To perform release on this muscle, start by getting into this position by bridging the hips up and then slowly lowering your back, then your butt down to lengthen the low back then put the ball under you where you feel the tender spot and roll to the same side slightly to add pressure as needed. Hold still and take deep breaths until the pain is at least 50% less or, ideally, just pressure.   These are your deep hip-flexor muscles. They are easiest to reach where they come together at the hip.   To perform release on this muscle, start by getting into this position by laying on your stomach then put the ball under you where you feel the tender spot and bring the opposite knee up/out to the side to add pressure as needed. Hold still and take deep breaths until the pain is at least 50% less or, ideally ,just pressure.   

## 2019-09-09 NOTE — Therapy (Signed)
[place] MAIN [facility] SERVICES [address] [place], [place], [address] Phone: [phone]   Fax:  [phone]  Physical Therapy Treatment  The patient has been informed of current processes in place at Outpatient Rehab to protect patients from Covid-19 exposure including social distancing, schedule modifications, and new cleaning procedures. After discussing their particular risk with a therapist based on the patient's personal risk factors, the patient has decided to proceed with in-person therapy.   Patient Details  Name: Tina Fowler MRN: [id number] Date of Birth: 1976-03-03 No data recorded  Encounter Date: 09/09/2019  PT End of Session - 09/09/19 1644    Visit Number  6    Number of Visits  10    Date for PT Re-Evaluation  08/29/19    Authorization Type  Cigna    Authorization - Visit Number  6    Authorization - Number of Visits  10    Progress Note Due on Visit  10    PT Start Time  8502    PT Stop Time  7741    PT Time Calculation (min)  60 min    Activity Tolerance  Patient tolerated treatment well;No increased pain    Behavior During Therapy  WFL for tasks assessed/performed       Past Medical History:  Diagnosis Date  . ADD (attention deficit disorder) without hyperactivity   . Depression   . Endometriosis of uterus   . Hypothyroidism   . Positive TB test   . Raynaud's disease     Past Surgical History:  Procedure Laterality Date  . CERVICAL CONE BIOPSY    . COLONOSCOPY    . COLONOSCOPY WITH PROPOFOL N/A 07/07/2016   Procedure: COLONOSCOPY WITH PROPOFOL;  Surgeon: Lollie Sails, MD;  Location: [facility] ENDOSCOPY;  Service: Endoscopy;  Laterality: N/A;  . DIAGNOSTIC LAPAROSCOPY     Pelvic (endometriosis)  . ESOPHAGOGASTRODUODENOSCOPY      There were no vitals filed for this visit.   Pelvic Floor Physical Therapy Treatment Note  SCREENING  Changes in medications, allergies, or medical history?: has started gut  assist for leaky gut.   SUBJECTIVE  Patient reports: Her back felt better after last session over the weekend. Exercising M-Th. Pain increased starting Monday and has been increased since then. L hip is worst. Had a BM this morning did not feel like she "completely emptied" maybe 1/2. Had to use soap to get it moving   Precautions:  ADD, Depression, Endometriosis, hypothyroidism, Raynaud's  Sexual activity/pain: Sometimes is uncomfortable because of pressure/bloating/bladder full feeling.   Location of pain: upper to low back worsens during menstrual cycle Current pain: 0/10 (tight)  Max pain: 7/10 Least pain: 0/10 Nature of pain:Sharp and dull  **0/10 following treatment.  Patient Goals: Having her bowel's work, having a BM daily.  OBJECTIVE  Changes in: Posture/Observations:  Mild hyperordosis/hyperkyphosis  Range of Motion/Flexibilty:   Strength/MMT:  LE MMT:   Pelvic Floor  (From previous session) [External Exam: Introitus Appears: mildly gaping Skin integrity: darkened skin surrounding vulva Palpation: no TTP Cough: parodoxical Prolapse visible?: no Scar mobility: WNL ~ 8 o'clock   Internal Vaginal Exam: Strength (PERF): 3/5 5 sec (cue to stop flow of urine" Symmetry: L>R for TTP Palpation: TTP throughout, worst at anterior PR/PC and coccygeus B. Prolapse: mild anterior wall~ 1 cm above introitus   Internal Rectal Exam:  Strength (PERF): 5/5 Symmetry: symmetrical Palpation: TTP at 6 o'clock near IAS>EAS Prolapse: none]   Abdominal:  Palpation: No TTP to L psoas, B Erector spinae anD multifidus across T-L junction.  Gait Analysis:  INTERVENTIONS THIS SESSION: Manual: Performed TP release and STM to L psoas, B Erector spinae and multifidus across T-L junction to decrease spasm and pain and allow for improved balance of musculature for improved function and decreased symptoms.  Therex: Educated on and practiced basic push-pull-press-squat  motions with emphasis on using deep-core musculature and not hyperextending through the lumbar spine to take pressure off of nerves at the T-L junction and allow for decreased constipation and pain.   Dry-needle: Performed TPDN with a .30x126mm needle to hip-flexors and with 4 .30x50mm needles with e-stim to lumbar erector spinae and multifidus using standard approach as described below to decrease spasm and pain and allow for improved balance of musculature for improved function and decreased symptoms.  E-stim: Performed TPDN with E-stim to multifidus across T-L junction and erector spinae to try to improve timing/flow of neurological information through the T11-L1 nerve roots to decrease constipation and pain    Total time: 60 min.                               PT Short Term Goals - 06/20/19 1417      PT SHORT TERM GOAL #1   Title  Patient will demonstrate improved pelvic alignment and balance of musculature surrounding the pelvis to facilitate decreased PFM spasms and decrease pelvic pain.    Baseline  L up-slip and Anterior rotation, spasms through posterior hip, Iliacus, and back    Time  5    Period  Weeks    Status  New    Target Date  07/25/19      PT SHORT TERM GOAL #2   Title  Patient will demonstrate a coordinated contraction, relaxation, and bulge of the pelvic floor muscles to demonstrate functional recruitment and motion and allow for further strengthening.    Baseline  Pt. Hx. suggests PFM spasms and poor coordination    Time  5    Period  Weeks    Status  New    Target Date  07/25/19      PT SHORT TERM GOAL #3   Title  Patient will demonstrate HEP x1 in the clinic to demonstrate understanding and proper form to allow for further improvement.    Baseline  Pt. lacks knowledge of therapeutic exercise that will decrease Sx.    Time  5    Period  Weeks    Status  New    Target Date  07/25/19        PT Long Term Goals - 06/20/19 1441       PT LONG TERM GOAL #1   Title  Patient will report no episodes of SUI over the course of the prior two weeks to demonstrate improved functional ability.    Baseline  Pt. having SUI with running, jumping, sneezing that fills a pad    Time  10    Period  Weeks    Status  New    Target Date  08/29/19      PT LONG TERM GOAL #2   Title  Patient will describe pain no greater than 2/10 over two weeks to demonstrate improved functional ability.    Baseline  Max of 8/10    Time  5    Period  Weeks    Status  New    Target Date  07/25/19      PT LONG TERM GOAL #3   Title  Patient will report having BM's at least every-other day with consistency between [facility] stool scale 3-5 over the prior week to demonstrate decreased constipation.    Baseline  Pt. goes 3-4 days between BM's, slow motility.    Time  10    Period  Days    Status  New    Target Date  08/29/19      PT LONG TERM GOAL #4   Title  Pt. will demonstrate an improvement in FOTO score by 10 points in each category to demonstrate improved function.    Baseline  FOTO PFDI Bowel:  17, Urinary problem: 52, Bowel Cnst: 43, PFDI Urinary: 29    Time  10    Period  Weeks    Status  New    Target Date  08/29/19            Plan - 09/09/19 1645    Clinical Impression Statement  Pt. Responded well to all interventions today, demonstrating decreased spasm and pain in the back and L hip, as well as understanding and correct performance of all education and exercises provided today. They will continue to benefit from skilled physical therapy to work toward remaining goals and maximize function as well as decrease likelihood of symptom increase or recurrence.    PT Next Visit Plan  re-assess T/L junction, perform PA mobs through thoracic spine. review exercise mechanics/running PRN review coordination of deep core and re-assess internally(for posterior wall ESP / continue coordination and strengthening PRN. teach splinting PRN?    PT Home  Exercise Plan  ILY massage, bowel retraining, mirilax, Child's pose stretch, Anti-kegels, hip-flexor stretch    Consulted and Agree with Plan of Care  Patient       Patient will benefit from skilled therapeutic intervention in order to improve the following deficits and impairments:     Visit Diagnosis: Other idiopathic scoliosis, thoracolumbar region  Sacrococcygeal disorders, not elsewhere classified  Other muscle spasm     Problem List There are no problems to display for this patient.  Cleophus Molt DPT, ATC Cleophus Molt 09/10/2019, 6:09 PM  [facility] MAIN [facility] SERVICES [address] [place], [place], [address] Phone: [phone]   Fax:  [phone]  Name: MIKI LABUDA MRN: [id number] Date of Birth: 1975/04/20

## 2019-09-16 ENCOUNTER — Ambulatory Visit: Payer: Managed Care, Other (non HMO)

## 2019-09-23 ENCOUNTER — Ambulatory Visit: Payer: Managed Care, Other (non HMO)

## 2019-09-23 DIAGNOSIS — M4125 Other idiopathic scoliosis, thoracolumbar region: Secondary | ICD-10-CM

## 2019-09-23 DIAGNOSIS — M62838 Other muscle spasm: Secondary | ICD-10-CM

## 2019-09-23 DIAGNOSIS — M533 Sacrococcygeal disorders, not elsewhere classified: Secondary | ICD-10-CM

## 2019-09-23 NOTE — Therapy (Signed)
[place] MAIN [facility] SERVICES [address] [place], [place], [address] Phone: [phone]   Fax:  [phone]  September 23, 2019   HNOID  Physical Therapy Discharge Summary  Patient: Tina Fowler  MRN: [id number]  Date of Birth: 06-22-1975   Diagnosis: Other idiopathic scoliosis, thoracolumbar region  Sacrococcygeal disorders, not elsewhere classified  Other muscle spasm No data recorded  The above patient had been seen in Physical Therapy 6 times of 10 treatments scheduled with 0 no shows and 10 cancellations.  The treatment consisted of Therapeutic exercises, manual treatment, e-stim, dry-needling, and self-care education The patient is: Improved  Subjective: She does not feel that PT is helping and would like to cancel remaining appointments.  Discharge Findings: Pt. Has made some progress but was not able to see significant change specifically in her ability to have a BM without needing to use soap inserted rectally to stimulate a BM. She continues to have urinary urgency and SUI when running due to increased PFM tone. Pt. Was encouraged to reach out to her primary care or OBGYN and ask about a referral to investigate whether she is a candidate for interstim due to her lack of response to conservative treatment.  Functional Status at Discharge: Independent  Goals Partially Met    Sincerely,  Willa Rough DPT, ATC Willa Rough, PT   CC No Recipients  [place] MAIN [facility] SERVICES [address] [place], [place], [address] Phone: [phone]   Fax:  [phone]  Patient: Tina Fowler  MRN: [id number]  Date of Birth: 06-Aug-1975

## 2019-09-30 ENCOUNTER — Ambulatory Visit: Payer: Managed Care, Other (non HMO)

## 2019-10-07 ENCOUNTER — Ambulatory Visit: Payer: Managed Care, Other (non HMO)

## 2020-05-20 ENCOUNTER — Other Ambulatory Visit: Payer: Self-pay | Admitting: Physical Medicine & Rehabilitation

## 2020-05-20 DIAGNOSIS — M5416 Radiculopathy, lumbar region: Secondary | ICD-10-CM

## 2020-06-18 ENCOUNTER — Ambulatory Visit
Admission: RE | Admit: 2020-06-18 | Discharge: 2020-06-18 | Disposition: A | Discharge status: Home / Self Care | Payer: Managed Care, Other (non HMO) | Source: Ambulatory Visit | Attending: Physical Medicine & Rehabilitation | Admitting: Physical Medicine & Rehabilitation

## 2020-06-18 DIAGNOSIS — M5416 Radiculopathy, lumbar region: Secondary | ICD-10-CM

## 2020-06-19 ENCOUNTER — Other Ambulatory Visit: Payer: Managed Care, Other (non HMO)

## 2021-03-02 ENCOUNTER — Other Ambulatory Visit: Payer: Self-pay | Admitting: Physical Medicine & Rehabilitation

## 2021-03-02 DIAGNOSIS — M546 Pain in thoracic spine: Secondary | ICD-10-CM

## 2021-03-02 DIAGNOSIS — G8929 Other chronic pain: Secondary | ICD-10-CM

## 2021-03-16 ENCOUNTER — Other Ambulatory Visit: Payer: Managed Care, Other (non HMO)

## 2022-06-02 DIAGNOSIS — Z800 Family history of malignant neoplasm of digestive organs: Secondary | ICD-10-CM | POA: Diagnosis not present

## 2022-06-02 DIAGNOSIS — K6389 Other specified diseases of intestine: Secondary | ICD-10-CM | POA: Diagnosis not present

## 2022-06-02 DIAGNOSIS — Z1211 Encounter for screening for malignant neoplasm of colon: Secondary | ICD-10-CM | POA: Diagnosis present

## 2022-08-23 IMAGING — MR MR LUMBAR SPINE W/O CM
4 of 5 series · 27 of 48 positions shown · non-contrast
Comparison: Previous radiograph from 04/21/2020.

CLINICAL DATA: Initial evaluation for chronic back pain for 4-5
years, no radiculopathy.

EXAM:
MRI LUMBAR SPINE WITHOUT CONTRAST
TECHNIQUE: Multiplanar, multisequence MR imaging of the lumbar spine was
performed. No intravenous contrast was administered.

[Series 3: T2 · sagittal · 4.0mm · 1.09mm/px · 6 of 17 slices shown (1 of 2)]
[im 1/17]
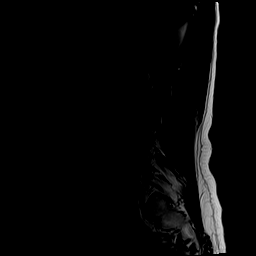
[im 4/17]
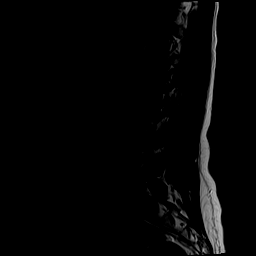
[im 7/17]
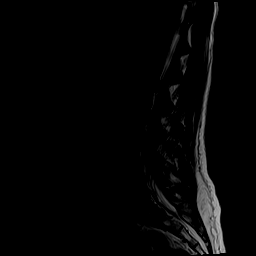
[im 10/17]
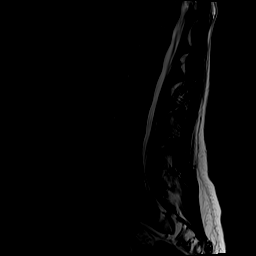
[im 13/17]
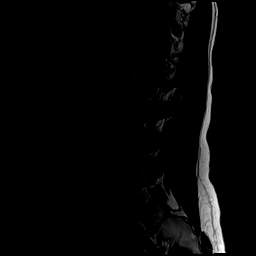
[im 17/17]
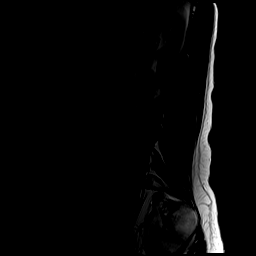

[Series 5: T1 · sagittal · 4.0mm · 1.09mm/px · 6 of 17 slices shown (1 of 2)]
[im 1/17]
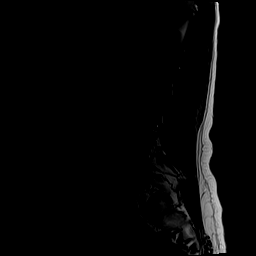
[im 4/17]
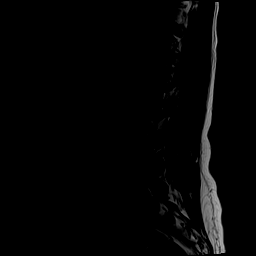
[im 7/17]
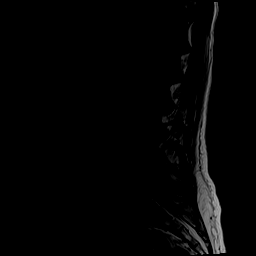
[im 10/17]
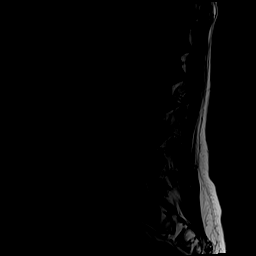
[im 13/17]
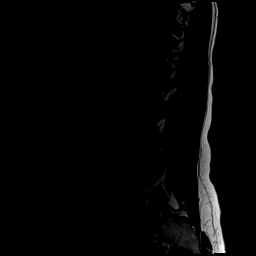
[im 17/17]
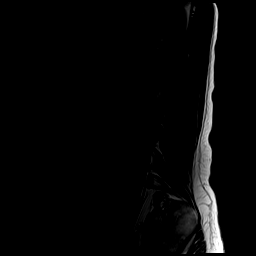

[Series 6: T2 · axial · 4.0mm · 0.39mm/px · z∈[-111,+123]mm · 9 of 44 slices shown (2 of 2)]
[im 1/44]
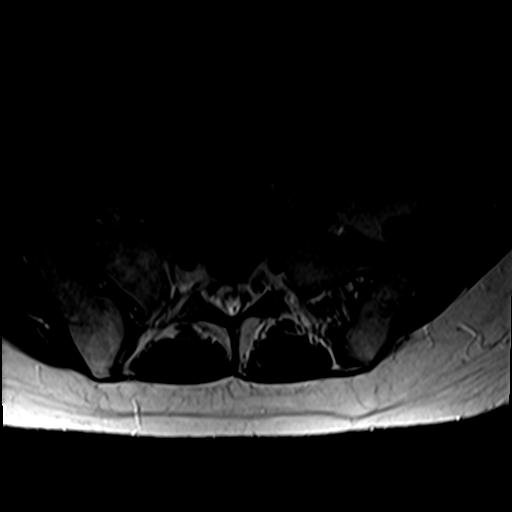
[im 7/44]
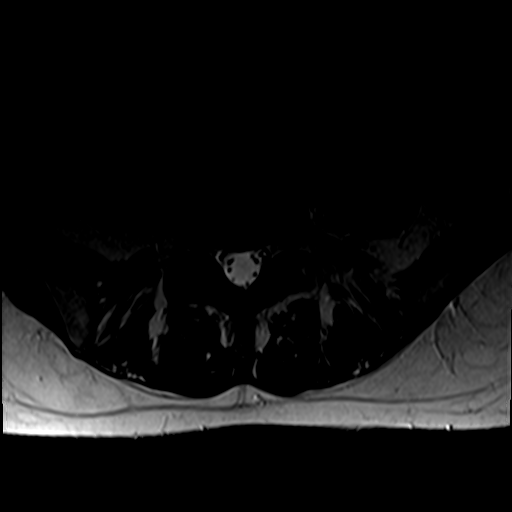
[im 13/44]
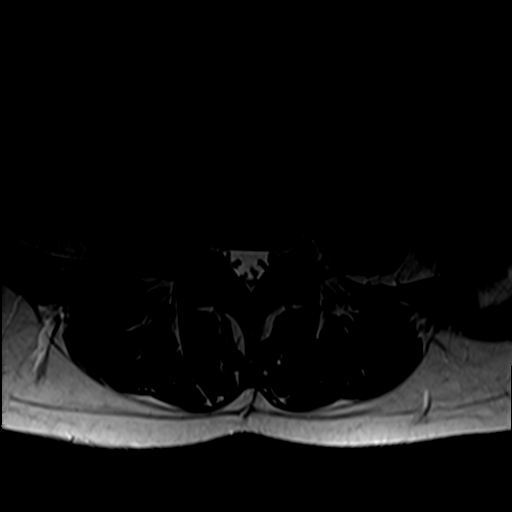
[im 19/44]
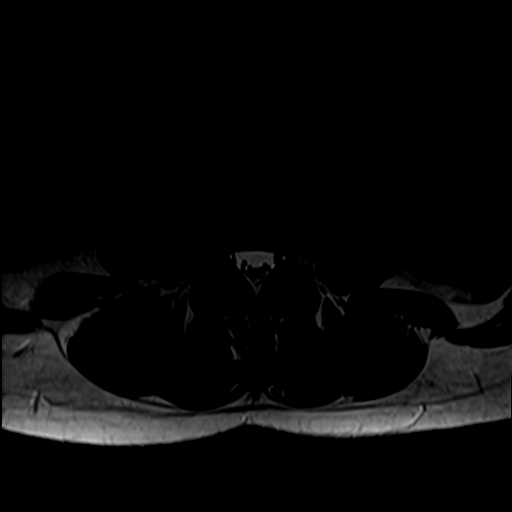
[im 22/44]
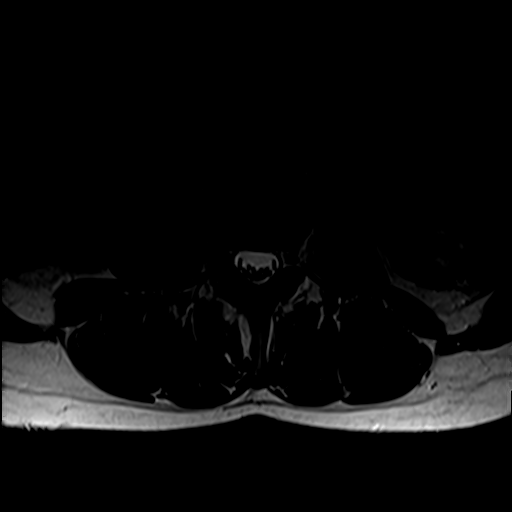
[im 25/44]
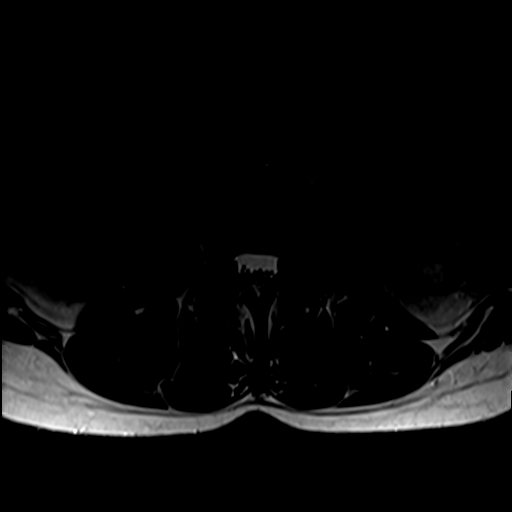
[im 31/44]
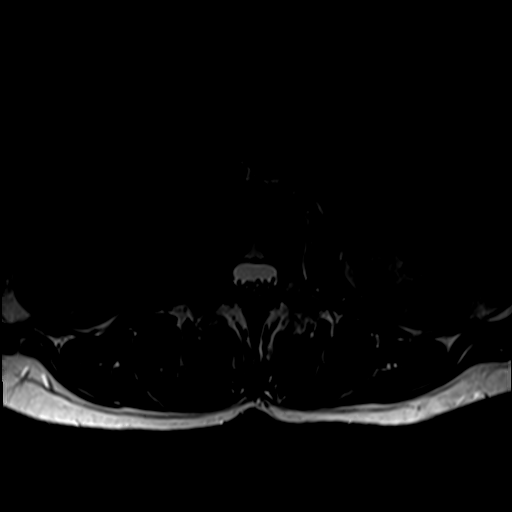
[im 37/44]
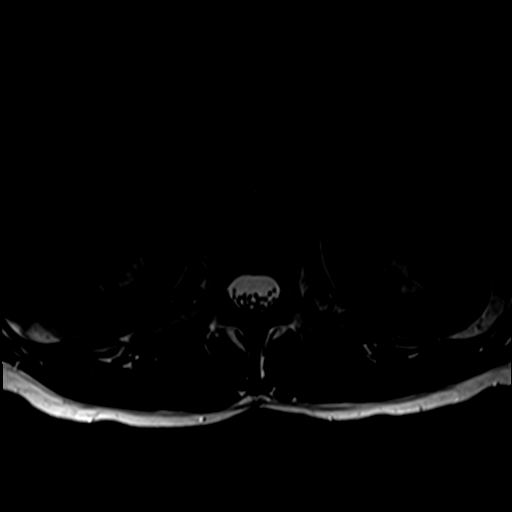
[im 44/44]
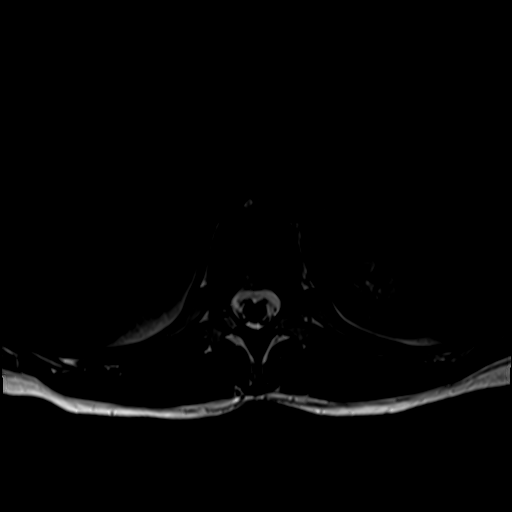

[Series 7: T1 · axial · 4.0mm · 0.39mm/px · z∈[-111,+89]mm · 6 of 44 slices shown (2 of 2)]
[im 1/44]
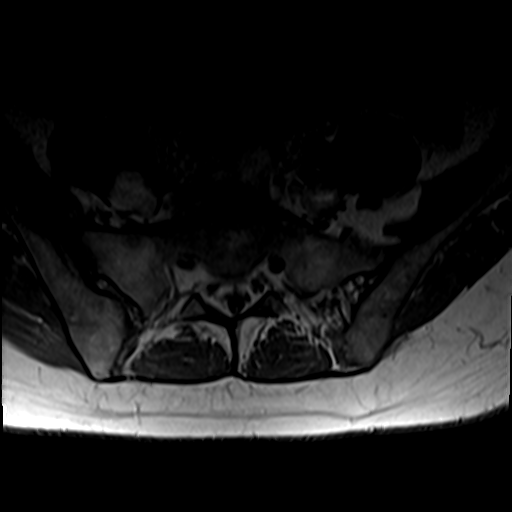
[im 7/44]
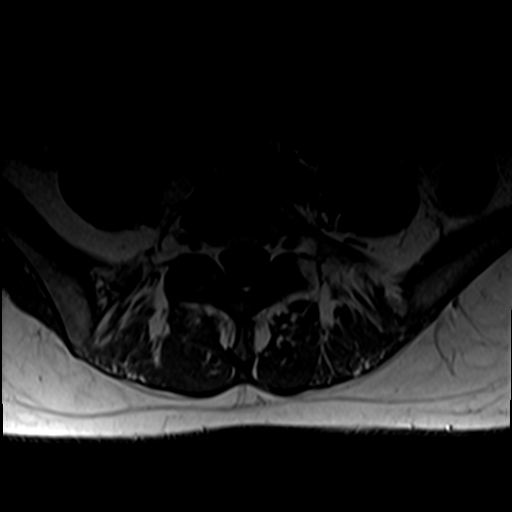
[im 13/44]
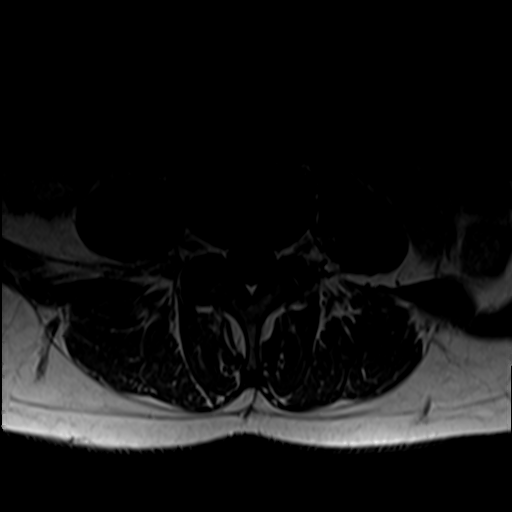
[im 19/44]
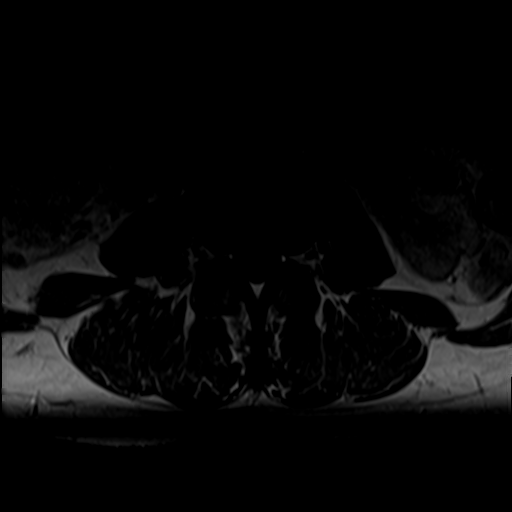
[im 22/44]
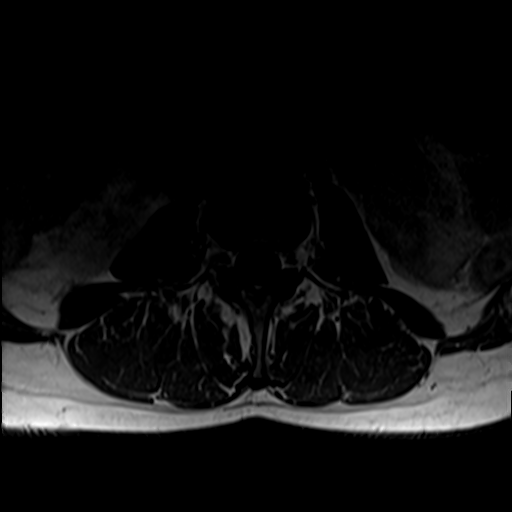
[im 37/44]
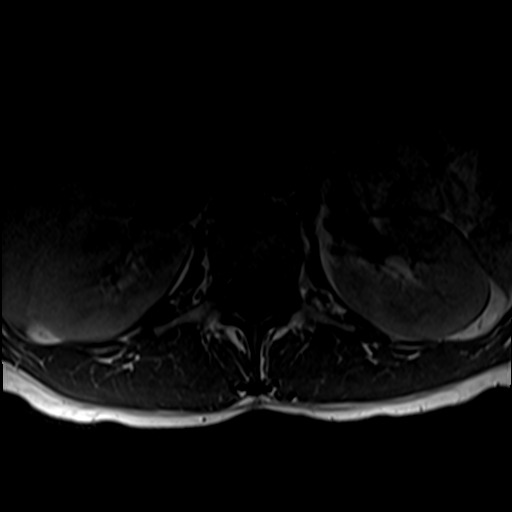

[27 of 48 positions shown; findings below may reference images not displayed]

FINDINGS: Segmentation: Standard. Lowest well-formed disc space labeled the
L5-S1 level.

Alignment: Trace levoscoliosis. Trace retrolisthesis of L3 on L4.
Alignment otherwise normal preservation of the normal lumbar
lordosis.

Vertebrae: Vertebral body height maintained without acute or chronic
fracture. Bone marrow signal intensity within normal limits. No
discrete or worrisome osseous lesions. Minimal reactive endplate
change noted about the anterior aspect of the L2-3 interspace. No
other abnormal marrow edema.

Conus medullaris and cauda equina: Conus extends to the T12-L1
level. Conus and cauda equina appear normal.

Paraspinal and other soft tissues: Unremarkable.

Disc levels:

L1-2:  Unremarkable.

L2-3: Normal interspace. Mild facet and ligament flavum hypertrophy.
No stenosis or impingement.

L3-4: Normal interspace. Mild facet and ligament flavum hypertrophy.
No canal or foraminal stenosis.

L4-5: Normal interspace. Mild facet and ligament flavum hypertrophy.
No canal stenosis. Foramina remain patent. No impingement.

L5-S1:  Unremarkable.
IMPRESSION: 1. Mild bilateral facet hypertrophy at L2-3 through L4-5.
2. Otherwise essentially normal MRI of the lumbar spine. No
significant disc pathology, stenosis, or evidence for neural
impingement.
